# Patient Record
Sex: Female | Born: 1985 | Race: Black or African American | Hispanic: No | Marital: Single | Smoking: Never smoker
Health system: Southern US, Community
[De-identification: ages and names within clinical notes are randomized; demographics above are authoritative.]

## PROBLEM LIST (undated history)

## (undated) DIAGNOSIS — F419 Anxiety disorder, unspecified: Secondary | ICD-10-CM

## (undated) DIAGNOSIS — F329 Major depressive disorder, single episode, unspecified: Secondary | ICD-10-CM

## (undated) DIAGNOSIS — J45909 Unspecified asthma, uncomplicated: Secondary | ICD-10-CM

## (undated) DIAGNOSIS — R0600 Dyspnea, unspecified: Secondary | ICD-10-CM

## (undated) DIAGNOSIS — F32A Depression, unspecified: Secondary | ICD-10-CM

## (undated) DIAGNOSIS — G4730 Sleep apnea, unspecified: Secondary | ICD-10-CM

## (undated) HISTORY — PX: CHOLECYSTECTOMY: SHX55

## (undated) HISTORY — PX: TOENAIL EXCISION: SUR558

---

## 2017-04-19 ENCOUNTER — Other Ambulatory Visit: Payer: Self-pay | Admitting: Family Medicine

## 2017-04-19 DIAGNOSIS — Z1231 Encounter for screening mammogram for malignant neoplasm of breast: Secondary | ICD-10-CM

## 2017-04-20 ENCOUNTER — Encounter: Payer: Self-pay | Admitting: Radiology

## 2017-04-20 ENCOUNTER — Ambulatory Visit
Admission: RE | Admit: 2017-04-20 | Discharge: 2017-04-20 | Disposition: A | Discharge status: Home / Self Care | Payer: Medicare Other | Source: Ambulatory Visit | Attending: Family Medicine | Admitting: Family Medicine

## 2017-04-20 DIAGNOSIS — Z1231 Encounter for screening mammogram for malignant neoplasm of breast: Secondary | ICD-10-CM

## 2017-04-24 ENCOUNTER — Other Ambulatory Visit: Payer: Self-pay | Admitting: Family Medicine

## 2017-04-24 DIAGNOSIS — Z803 Family history of malignant neoplasm of breast: Secondary | ICD-10-CM

## 2017-05-09 ENCOUNTER — Other Ambulatory Visit: Payer: Medicare Other

## 2017-05-22 ENCOUNTER — Other Ambulatory Visit: Payer: Self-pay | Admitting: *Deleted

## 2017-05-22 ENCOUNTER — Inpatient Hospital Stay
Admission: RE | Admit: 2017-05-22 | Discharge: 2017-05-22 | Disposition: A | Discharge status: Home / Self Care | Payer: Self-pay | Source: Ambulatory Visit | Attending: *Deleted | Admitting: *Deleted

## 2017-05-22 DIAGNOSIS — Z9289 Personal history of other medical treatment: Secondary | ICD-10-CM

## 2017-06-01 ENCOUNTER — Other Ambulatory Visit: Payer: Medicare Other

## 2017-06-13 ENCOUNTER — Ambulatory Visit
Admission: RE | Admit: 2017-06-13 | Discharge: 2017-06-13 | Disposition: A | Discharge status: Home / Self Care | Payer: Medicare Other | Source: Ambulatory Visit | Attending: Family Medicine | Admitting: Family Medicine

## 2017-06-13 DIAGNOSIS — N644 Mastodynia: Secondary | ICD-10-CM | POA: Diagnosis present

## 2017-06-13 DIAGNOSIS — Z803 Family history of malignant neoplasm of breast: Secondary | ICD-10-CM

## 2017-10-29 ENCOUNTER — Encounter: Payer: Self-pay | Admitting: Emergency Medicine

## 2017-10-29 ENCOUNTER — Other Ambulatory Visit: Payer: Self-pay

## 2017-10-29 DIAGNOSIS — R0602 Shortness of breath: Secondary | ICD-10-CM | POA: Diagnosis present

## 2017-10-29 DIAGNOSIS — Z79899 Other long term (current) drug therapy: Secondary | ICD-10-CM | POA: Insufficient documentation

## 2017-10-29 DIAGNOSIS — J45901 Unspecified asthma with (acute) exacerbation: Secondary | ICD-10-CM | POA: Insufficient documentation

## 2017-10-29 NOTE — ED Triage Notes (Addendum)
Pt says about 4pm today her asthma flared up, was coughing until she vomited; tightness in chest; headache; pt used inhalers with no relief; still feeling short of breath; pt talking in complete coherent sentences; lungs clear to auscultation;

## 2017-10-30 ENCOUNTER — Emergency Department
Admission: EM | Admit: 2017-10-30 | Discharge: 2017-10-30 | Disposition: A | Discharge status: Home / Self Care | Payer: Medicare Other | Attending: Emergency Medicine | Admitting: Emergency Medicine

## 2017-10-30 DIAGNOSIS — J45901 Unspecified asthma with (acute) exacerbation: Secondary | ICD-10-CM | POA: Diagnosis not present

## 2017-10-30 HISTORY — DX: Unspecified asthma, uncomplicated: J45.909

## 2017-10-30 HISTORY — DX: Major depressive disorder, single episode, unspecified: F32.9

## 2017-10-30 HISTORY — DX: Depression, unspecified: F32.A

## 2017-10-30 MED ORDER — ONDANSETRON 4 MG PO TBDP
4.0000 mg | ORAL_TABLET | Freq: Once | ORAL | Status: AC
  Administered 2017-10-30: 4 mg via ORAL
  Filled 2017-10-30: qty 1

## 2017-10-30 MED ORDER — IPRATROPIUM-ALBUTEROL 0.5-2.5 (3) MG/3ML IN SOLN
3.0000 mL | Freq: Once | RESPIRATORY_TRACT | Status: AC
  Administered 2017-10-30: 3 mL via RESPIRATORY_TRACT
  Filled 2017-10-30: qty 3

## 2017-10-30 MED ORDER — PREDNISONE 50 MG PO TABS: 50.0000 mg | ORAL_TABLET | Freq: Every day | ORAL | 0 refills | Status: AC

## 2017-10-30 MED ORDER — ONDANSETRON 4 MG PO TBDP: 4.0000 mg | ORAL_TABLET | Freq: Three times a day (TID) | ORAL | 0 refills | Status: DC | PRN

## 2017-10-30 MED ORDER — SPACER/AERO CHAMBER MOUTHPIECE MISC: 1.0000 [IU] | 0 refills | Status: AC | PRN

## 2017-10-30 MED ORDER — PREDNISONE 20 MG PO TABS
60.0000 mg | ORAL_TABLET | Freq: Once | ORAL | Status: AC
  Administered 2017-10-30: 60 mg via ORAL
  Filled 2017-10-30: qty 3

## 2017-10-30 NOTE — Discharge Instructions (Signed)
Please take your steroids as prescribed for the next 4 days.  Follow-up with primary care as needed and return to the emergency department for any concerns.  It was a pleasure to take care of you today, and thank you for coming to our emergency department.  If you have any questions or concerns before leaving please ask the nurse to grab me and I'm more than happy to go through your aftercare instructions again.  If you were prescribed any opioid pain medication today such as Norco, Vicodin, Percocet, morphine, hydrocodone, or oxycodone please make sure you do not drive when you are taking this medication as it can alter your ability to drive safely.  If you have any concerns once you are home that you are not improving or are in fact getting worse before you can make it to your follow-up appointment, please do not hesitate to call 911 and come back for further evaluation.  Merrily BrittleNeil Yalitza Teed, MD

## 2017-10-30 NOTE — ED Notes (Signed)

## 2017-10-30 NOTE — ED Notes (Signed)
ED Provider at bedside. 

## 2017-10-30 NOTE — ED Provider Notes (Signed)
[facility] Emergency Department Provider Note  ____________________________________________   First MD Initiated Contact with Patient 10/30/17 [phone]     (approximate)  I have reviewed the triage vital signs and the nursing notes.   HISTORY  Chief Complaint No chief complaint on file.   HPI Erica Webster is a 32 y.o. female who self presents to the emergency department with gradual onset shortness of breath mild to moderate severity that began around 4 PM today.  She has a long-standing history of asthma and his use your inhaler at home with minimal improvement.  Her symptoms seem to be somewhat worse with exertion and somewhat improved with rest.  She has never been intubated.  She reports recent URI symptoms.  Past Medical History:  Diagnosis Date  . Asthma   . Depression     There are no active problems to display for this patient.   History reviewed. No pertinent surgical history.  Prior to Admission medications   Medication Sig Start Date End Date Taking? Authorizing Provider  albuterol (PROVENTIL HFA;VENTOLIN HFA) 108 (90 Base) MCG/ACT inhaler Inhale 2 puffs into the lungs every 4 (four) hours as needed for wheezing or shortness of breath.   Yes [provider]  ARIPiprazole (ABILIFY) 15 MG tablet Take 15 mg by mouth daily.   Yes [provider]  Fluticasone-Salmeterol (ADVAIR) 250-50 MCG/DOSE AEPB Inhale 1 puff into the lungs 2 (two) times daily.   Yes [provider]  vitamin B-12 (CYANOCOBALAMIN) 500 MCG tablet Take 500 mcg by mouth daily.   Yes [provider]  ondansetron (ZOFRAN ODT) 4 MG disintegrating tablet Take 1 tablet (4 mg total) by mouth every 8 (eight) hours as needed for nausea or vomiting. 10/30/17   Merrily Brittleifenbark, Marjie Chea, MD  predniSONE (DELTASONE) 50 MG tablet Take 1 tablet (50 mg total) by mouth daily for 4 days. 10/30/17 11/03/17  Merrily Brittleifenbark, Anastasya Jewell, MD  Spacer/Aero Chamber Mouthpiece MISC 1 Units by  Does not apply route every 4 (four) hours as needed (wheezing). 10/30/17   Merrily Brittleifenbark, Kenston Longton, MD    Allergies Shellfish allergy and Penicillins  Family History  Adopted: Yes  Problem Relation Age of Onset  . Breast cancer Neg Hx     Social History Social History   Tobacco Use  . Smoking status: Never Smoker  . Smokeless tobacco: Never Used  Substance Use Topics  . Alcohol use: No    Frequency: Never  . Drug use: No    Review of Systems Constitutional: No fever/chills ENT: No sore throat. Cardiovascular: Denies chest pain. Respiratory: Positive for shortness of breath. Gastrointestinal: No abdominal pain.  No nausea, no vomiting.  No diarrhea.  No constipation. Musculoskeletal: Negative for back pain. Neurological: Negative for headaches   ____________________________________________   PHYSICAL EXAM:  VITAL SIGNS: ED Triage Vitals  Enc Vitals Group     BP 10/29/17 2258 130/81     Pulse Rate 10/29/17 2258 (!) 117     Resp 10/29/17 2258 16     Temp 10/29/17 2258 99.4 F (37.4 C)     Temp Source 10/29/17 2258 Oral     SpO2 10/29/17 2258 97 %     Weight 10/29/17 2258 285 lb (129.3 kg)     Height 10/29/17 2258 5\' 1"  (1.549 m)     Head Circumference --      Peak Flow --      Pain Score 10/29/17 2305 9     Pain Loc --  Pain Edu? --      Excl. in GC? --     Constitutional: Alert and oriented x4 pleasant cooperative speaks in full clear sentences no diaphoresis Head: Atraumatic. Nose: No congestion/rhinnorhea. Mouth/Throat: No trismus Neck: No stridor.   Cardiovascular: Rate and rhythm Respiratory: Normal respiratory effort.  Slight expiratory wheeze throughout but moving good air Gastrointestinal: Soft nontender Neurologic:  Normal speech and language. No gross focal neurologic deficits are appreciated.  Skin:  Skin is warm, dry and intact. No rash noted.    ____________________________________________  LABS (all labs ordered are listed, but only  abnormal results are displayed)  Labs Reviewed - No data to display   __________________________________________  EKG   ____________________________________________  RADIOLOGY   ____________________________________________   DIFFERENTIAL includes but not limited to  Asthma exacerbation, upper respiratory tract infection, pneumonia, pneumothorax   PROCEDURES  Procedure(s) performed: no  Procedures  Critical Care performed: no  Observation: no ____________________________________________   INITIAL IMPRESSION / ASSESSMENT AND PLAN / ED COURSE  Pertinent labs & imaging results that were available during my care of the patient were reviewed by me and considered in my medical decision making (see chart for details).  Patient arrives hemodynamically stable and very well-appearing with mild wheeze.  Given 2 DuoNeb's here with improvement in her symptoms.  I will give her a 5-day course of prednisone as well as prescribe her a spacer at home.  Strict return precautions have been given and the patient verbalizes understanding and agreement with the plan.      ____________________________________________   FINAL CLINICAL IMPRESSION(S) / ED DIAGNOSES  Final diagnoses:  Mild asthma with exacerbation, unspecified whether persistent      NEW MEDICATIONS STARTED DURING THIS VISIT:  Discharge Medication List as of 10/30/2017 12:40 AM    START taking these medications   Details  ondansetron (ZOFRAN ODT) 4 MG disintegrating tablet Take 1 tablet (4 mg total) by mouth every 8 (eight) hours as needed for nausea or vomiting., Starting Mon 10/30/2017, Print    predniSONE (DELTASONE) 50 MG tablet Take 1 tablet (50 mg total) by mouth daily for 4 days., Starting Mon 10/30/2017, Until Fri 11/03/2017, Print    Spacer/Aero Chamber Mouthpiece MISC 1 Units by Does not apply route every 4 (four) hours as needed (wheezing)., Starting Mon 10/30/2017, Print         Note:  This document  was prepared using Dragon voice recognition software and may include unintentional dictation errors.      Merrily Brittle, MD 10/30/17 [phone]

## 2018-03-02 ENCOUNTER — Emergency Department
Admission: EM | Admit: 2018-03-02 | Discharge: 2018-03-02 | Disposition: A | Discharge status: Home / Self Care | Payer: Medicare Other | Attending: Emergency Medicine | Admitting: Emergency Medicine

## 2018-03-02 ENCOUNTER — Encounter: Payer: Self-pay | Admitting: Emergency Medicine

## 2018-03-02 ENCOUNTER — Other Ambulatory Visit: Payer: Self-pay

## 2018-03-02 DIAGNOSIS — J45909 Unspecified asthma, uncomplicated: Secondary | ICD-10-CM | POA: Diagnosis not present

## 2018-03-02 DIAGNOSIS — R112 Nausea with vomiting, unspecified: Secondary | ICD-10-CM | POA: Diagnosis not present

## 2018-03-02 DIAGNOSIS — Z79899 Other long term (current) drug therapy: Secondary | ICD-10-CM | POA: Diagnosis not present

## 2018-03-02 DIAGNOSIS — R197 Diarrhea, unspecified: Secondary | ICD-10-CM | POA: Insufficient documentation

## 2018-03-02 MED ORDER — ONDANSETRON 4 MG PO TBDP: 4.0000 mg | ORAL_TABLET | Freq: Three times a day (TID) | ORAL | 0 refills | Status: DC | PRN

## 2018-03-02 MED ORDER — ONDANSETRON 4 MG PO TBDP
4.0000 mg | ORAL_TABLET | Freq: Once | ORAL | Status: AC
  Administered 2018-03-02: 4 mg via ORAL
  Filled 2018-03-02: qty 1

## 2018-03-02 MED ORDER — LOPERAMIDE HCL 2 MG PO CAPS
4.0000 mg | ORAL_CAPSULE | Freq: Once | ORAL | Status: AC
  Administered 2018-03-02: 4 mg via ORAL
  Filled 2018-03-02: qty 2

## 2018-03-02 NOTE — Discharge Instructions (Signed)
You likely have symptoms of a viral gastroenteritis (stomach flu), which is a common cause of nausea, vomiting, and diarrhea. Your symptoms appear to be getting better. Take the nausea medicine as needed. Drink plenty of fluids to prevent dehydration. Follow-up with your provider, next week as scheduled.

## 2018-03-02 NOTE — ED Notes (Signed)
Pt stating that she was seen by PCP last month and was placed on Singular for her asthma. Pt stating a week after she started taking it she was having n/v/d. Pt stating that she stopped taking the Singular 2 weeks ago. Pt stating that 2 days ago she started with sinus congestion and cough. Pt denying fevers.

## 2018-03-02 NOTE — ED Triage Notes (Signed)
Says her pcp put her on singulair about 3 weeks ago.  A week after that she started having diarrhea and vomiting.  Now she has a cold.  Her doctor could not see her today, but has appt jone 6.  Patient in nad.

## 2018-03-05 NOTE — ED Provider Notes (Signed)
[facility] Emergency Department Provider Note ____________________________________________  Time seen: 1300  I have reviewed the triage vital signs and the nursing notes.  HISTORY  Chief Complaint  Diarrhea; Emesis; and URI  HPI Erica Webster is a 32 y.o. female presents to the ED from her adult daycare, for evaluation of a one-week complaint of intermittent nausea, vomiting, and diarrhea.  Patient initially thought that she had a reaction to Singulair that her provider placed on about 3 weeks prior.  She describes taking medication for nearly a week, before she discontinued medication.  She has not had any fevers, chills, sweats.  She also reports nausea today without any incident vomiting.  She is continued to have some loose stools but has been able to tolerate solid foods.  She was scheduled to see her doctor on June 6, but presented today for further evaluation.  She denies any interim fevers, chills, or sweats.  She also denies any sick contacts, recent travel, or other exposures.  X medications daily for asthma and depression.  Past Medical History:  Diagnosis Date  . Asthma   . Depression     There are no active problems to display for this patient.   Past Surgical History:  Procedure Laterality Date  . TOENAIL EXCISION      Prior to Admission medications   Medication Sig Start Date End Date Taking? Authorizing Provider  albuterol (PROVENTIL HFA;VENTOLIN HFA) 108 (90 Base) MCG/ACT inhaler Inhale 2 puffs into the lungs every 4 (four) hours as needed for wheezing or shortness of breath.    [provider]  ARIPiprazole (ABILIFY) 15 MG tablet Take 15 mg by mouth daily.    [provider]  Fluticasone-Salmeterol (ADVAIR) 250-50 MCG/DOSE AEPB Inhale 1 puff into the lungs 2 (two) times daily.    [provider]  ondansetron (ZOFRAN ODT) 4 MG disintegrating tablet Take 1 tablet (4 mg total) by mouth every 8 (eight) hours as  needed. 03/02/18   Sunny Gains, Charlesetta IvoryJenise V Bacon, PA-C  Spacer/Aero Chamber Mouthpiece MISC 1 Units by Does not apply route every 4 (four) hours as needed (wheezing). 10/30/17   Merrily Brittleifenbark, Neil, MD  vitamin B-12 (CYANOCOBALAMIN) 500 MCG tablet Take 500 mcg by mouth daily.    [provider]    Allergies Shellfish allergy and Penicillins  Family History  Adopted: Yes  Problem Relation Age of Onset  . Breast cancer Neg Hx     Social History Social History   Tobacco Use  . Smoking status: Never Smoker  . Smokeless tobacco: Never Used  Substance Use Topics  . Alcohol use: No    Frequency: Never  . Drug use: No    Review of Systems  Constitutional: Negative for fever. Eyes: Negative for visual changes. ENT: Negative for sore throat. Cardiovascular: Negative for chest pain. Respiratory: Negative for shortness of breath. Gastrointestinal: Negative for abdominal pain. Reports improved vomiting and diarrhea. Genitourinary: Negative for dysuria. Musculoskeletal: Negative for back pain. Skin: Negative for rash. Neurological: Negative for headaches, focal weakness or numbness. ____________________________________________  PHYSICAL EXAM:  VITAL SIGNS: ED Triage Vitals [03/02/18 1106]  Enc Vitals Group     BP 134/71     Pulse Rate 98     Resp 16     Temp 98.1 F (36.7 C)     Temp Source Oral     SpO2 97 %     Weight 293 lb (132.9 kg)     Height 5\' 1"  (1.549 m)  Head Circumference      Peak Flow      Pain Score 0     Pain Loc      Pain Edu?      Excl. in GC?     Constitutional: Alert and oriented. Well appearing and in no distress. Head: Normocephalic and atraumatic. Eyes: Conjunctivae are normal. PERRL. Normal extraocular movements Ears: Canals clear. TMs intact bilaterally. Nose: No congestion/rhinorrhea/epistaxis. Mouth/Throat: Mucous membranes are moist. Cardiovascular: Normal rate, regular rhythm. Normal distal pulses. Respiratory: Normal respiratory  effort. No wheezes/rales/rhonchi. Gastrointestinal: Soft and nontender. No distention, rebound, guarding, or rigidity.  Normoactive bowel sounds noted. Musculoskeletal: Nontender with normal range of motion in all extremities.  Neurologic:  Normal gait without ataxia. Normal speech and language. No gross focal neurologic deficits are appreciated. Skin:  Skin is warm, dry and intact. No rash noted. Psychiatric: Mood and affect are normal. Patient exhibits appropriate insight and judgment. ____________________________________________  PROCEDURES  Procedures Zofran 4 mg ODT Imodium 4 mg PO ____________________________________________  INITIAL IMPRESSION / ASSESSMENT AND PLAN / ED COURSE  She with ED evaluation of intermittent nausea, vomiting, and diarrhea.  Patient symptoms have been improving over the last few days.  She was concerned for a possible drug side effect, but her symptoms seem likely due to a viral etiology.  She is stable in the ED today and has an overall benign exam.  She will be discharged with prescriptions for Zofran to dose as needed for nausea and vomiting.  She may dose over-the-counter Imodium for any ongoing diarrhea symptoms.  She is given instructions on a BRAT diet, and will follow up with the primary provider as scheduled.  Return precautions have been reviewed.  ____________________________________________  FINAL CLINICAL IMPRESSION(S) / ED DIAGNOSES  Final diagnoses:  Nausea vomiting and diarrhea      Karmen Stabs, Charlesetta Ivory, PA-C 03/05/18 1909    Dionne Bucy, MD 03/13/18 1319

## 2019-03-08 ENCOUNTER — Other Ambulatory Visit: Payer: Self-pay

## 2019-03-08 ENCOUNTER — Encounter: Payer: Self-pay | Admitting: Emergency Medicine

## 2019-03-08 ENCOUNTER — Emergency Department
Admission: EM | Admit: 2019-03-08 | Discharge: 2019-03-08 | Disposition: A | Discharge status: Home / Self Care | Payer: Medicare Other | Attending: Emergency Medicine | Admitting: Emergency Medicine

## 2019-03-08 DIAGNOSIS — Z79899 Other long term (current) drug therapy: Secondary | ICD-10-CM | POA: Diagnosis not present

## 2019-03-08 DIAGNOSIS — R062 Wheezing: Secondary | ICD-10-CM | POA: Diagnosis present

## 2019-03-08 DIAGNOSIS — J4521 Mild intermittent asthma with (acute) exacerbation: Secondary | ICD-10-CM | POA: Diagnosis not present

## 2019-03-08 MED ORDER — PREDNISONE 50 MG PO TABS: ORAL_TABLET | ORAL | 0 refills | Status: DC

## 2019-03-08 NOTE — ED Triage Notes (Signed)
Patient states that she has a history of asthma and feels like she is having an asthma flare that started this morning. Patient states that she has used her inhaler twice with some relief but the symptoms return. Patient with clear lung sounds at this time.

## 2019-03-08 NOTE — ED Provider Notes (Signed)
[facility] Emergency Department Provider Note  ____________________________________________  Time seen: Approximately 8:36 PM  I have reviewed the triage vital signs and the nursing notes.   HISTORY  Chief Complaint Asthma    HPI Erica Webster is a 33 y.o. female presents to the emergency department with concern for asthma.  Patient states that she experience wheezing and chest tightness with mild cough that started today.  Patient states that she has had to use her albuterol inhaler twice.  Patient states that she typically has an asthma exacerbation this time of year.  She denies rhinorrhea, congestion, body aches or fever.  No other alleviating measures have been attempted.        Past Medical History:  Diagnosis Date  . Asthma   . Depression     There are no active problems to display for this patient.   Past Surgical History:  Procedure Laterality Date  . TOENAIL EXCISION      Prior to Admission medications   Medication Sig Start Date End Date Taking? Authorizing Provider  albuterol (PROVENTIL HFA;VENTOLIN HFA) 108 (90 Base) MCG/ACT inhaler Inhale 2 puffs into the lungs every 4 (four) hours as needed for wheezing or shortness of breath.    [provider]  ARIPiprazole (ABILIFY) 15 MG tablet Take 15 mg by mouth daily.    [provider]  Fluticasone-Salmeterol (ADVAIR) 250-50 MCG/DOSE AEPB Inhale 1 puff into the lungs 2 (two) times daily.    [provider]  ondansetron (ZOFRAN ODT) 4 MG disintegrating tablet Take 1 tablet (4 mg total) by mouth every 8 (eight) hours as needed. 03/02/18   Menshew, Charlesetta Ivory, PA-C  predniSONE (DELTASONE) 50 MG tablet Take one tablet once daily for the next five days. 03/08/19   Orvil Feil, PA-C  Spacer/Aero Chamber Mouthpiece MISC 1 Units by Does not apply route every 4 (four) hours as needed (wheezing). 10/30/17   Merrily Brittle, MD  vitamin B-12 (CYANOCOBALAMIN) 500 MCG  tablet Take 500 mcg by mouth daily.    [provider]    Allergies Shellfish allergy and Penicillins  Family History  Adopted: Yes  Problem Relation Age of Onset  . Breast cancer Neg Hx     Social History Social History   Tobacco Use  . Smoking status: Never Smoker  . Smokeless tobacco: Never Used  Substance Use Topics  . Alcohol use: No    Frequency: Never  . Drug use: No     Review of Systems  Constitutional: No fever/chills Eyes: No visual changes. No discharge ENT: No upper respiratory complaints. Cardiovascular: no chest pain. Respiratory: Patient has had sporadic cough and wheezing.  Gastrointestinal: No abdominal pain.  No nausea, no vomiting.  No diarrhea.  No constipation. Musculoskeletal: Negative for musculoskeletal pain. Skin: Negative for rash, abrasions, lacerations, ecchymosis. Neurological: Negative for headaches, focal weakness or numbness.   ____________________________________________   PHYSICAL EXAM:  VITAL SIGNS: ED Triage Vitals  Enc Vitals Group     BP 03/08/19 2012 139/89     Pulse Rate 03/08/19 2012 (!) 105     Resp 03/08/19 2012 18     Temp 03/08/19 2012 98.4 F (36.9 C)     Temp Source 03/08/19 2012 Oral     SpO2 03/08/19 2012 98 %     Weight 03/08/19 2016 285 lb (129.3 kg)     Height 03/08/19 2016 5\' 2"  (1.575 m)     Head Circumference --      Peak  Flow --      Pain Score 03/08/19 2016 0     Pain Loc --      Pain Edu? --      Excl. in GC? --      Constitutional: Alert and oriented. Well appearing and in no acute distress. Eyes: Conjunctivae are normal. PERRL. EOMI. Head: Atraumatic. ENT:      Ears: TMs are pearly.      Nose: No congestion/rhinnorhea.      Mouth/Throat: Mucous membranes are moist.  Neck: No stridor.  No cervical spine tenderness to palpation. Cardiovascular: Normal rate, regular rhythm. Normal S1 and S2.  Good peripheral circulation. Respiratory: Normal respiratory effort without tachypnea or  retractions. Lungs CTAB. Good air entry to the bases with no decreased or absent breath sounds. Skin:  Skin is warm, dry and intact. No rash noted. Psychiatric: Mood and affect are normal. Speech and behavior are normal. Patient exhibits appropriate insight and judgement.   ____________________________________________   LABS (all labs ordered are listed, but only abnormal results are displayed)  Labs Reviewed - No data to display ____________________________________________  EKG   ____________________________________________  RADIOLOGY   No results found.  ____________________________________________    PROCEDURES  Procedure(s) performed:    Procedures    Medications - No data to display   ____________________________________________   INITIAL IMPRESSION / ASSESSMENT AND PLAN / ED COURSE  Pertinent labs & imaging results that were available during my care of the patient were reviewed by me and considered in my medical decision making (see chart for details).  Review of the [place] CSRS was performed in accordance of the [facility] prior to dispensing any controlled drugs.         Assessment and Plan:  Asthma Patient presents to the emergency department with concern for asthma.  Patient reports wheezing and chest tightness that occurred earlier in the day that resolved after she used her albuterol inhaler.  On physical exam, patient had no adventitious lung sounds.  She had no increased work of breathing or use of accessory muscles for respiration.  Patient was started on prednisone once daily for the next 5 days.  She was advised to follow-up with primary care as needed.  She assured me that she had plenty of albuterol.  All patient questions were answered.     ____________________________________________  FINAL CLINICAL IMPRESSION(S) / ED DIAGNOSES  Final diagnoses:  Mild intermittent asthma with exacerbation      NEW MEDICATIONS STARTED DURING THIS  VISIT:  ED Discharge Orders         Ordered    predniSONE (DELTASONE) 50 MG tablet     03/08/19 2035              This chart was dictated using voice recognition software/Dragon. Despite best efforts to proofread, errors can occur which can change the meaning. Any change was purely unintentional.    Gasper LloydWoods, Tanayah Squitieri M, PA-C 03/08/19 2039    Dionne BucySiadecki, Sebastian, MD 03/08/19 2045

## 2019-12-05 ENCOUNTER — Emergency Department: Payer: Medicare Other

## 2019-12-05 ENCOUNTER — Other Ambulatory Visit: Payer: Self-pay

## 2019-12-05 ENCOUNTER — Emergency Department
Admission: EM | Admit: 2019-12-05 | Discharge: 2019-12-06 | Disposition: A | Discharge status: Home / Self Care | Payer: Medicare Other | Attending: Emergency Medicine | Admitting: Emergency Medicine

## 2019-12-05 DIAGNOSIS — J4521 Mild intermittent asthma with (acute) exacerbation: Secondary | ICD-10-CM

## 2019-12-05 DIAGNOSIS — R059 Cough, unspecified: Secondary | ICD-10-CM

## 2019-12-05 DIAGNOSIS — R0602 Shortness of breath: Secondary | ICD-10-CM | POA: Diagnosis present

## 2019-12-05 DIAGNOSIS — Z79899 Other long term (current) drug therapy: Secondary | ICD-10-CM | POA: Insufficient documentation

## 2019-12-05 DIAGNOSIS — R05 Cough: Secondary | ICD-10-CM

## 2019-12-05 DIAGNOSIS — Z20822 Contact with and (suspected) exposure to covid-19: Secondary | ICD-10-CM | POA: Insufficient documentation

## 2019-12-05 DIAGNOSIS — R0600 Dyspnea, unspecified: Secondary | ICD-10-CM

## 2019-12-05 NOTE — ED Triage Notes (Signed)
PT to ED via EMS for SOB x2 weeks. HX of asthma, no relief with albuterol inhaler. Upon ems arrival pt o2 was 98%. After 1 albuterol neb pt o2 is currently 99%. Possible covid exposure 3 weeks ago.

## 2019-12-05 NOTE — ED Provider Notes (Signed)
[facility] Emergency Department Provider Note  ____________________________________________   First MD Initiated Contact with Patient 12/05/19 2321     (approximate)  I have reviewed the triage vital signs and the nursing notes.   HISTORY  Chief Complaint Shortness of Breath    HPI Erica Webster is a 34 y.o. female with below list of previous medical conditions presents to the emergency department via EMS secondary to dyspnea with onset today despite the fact the note states that it has been for the past 2 weeks.  Patient states that earlier today she felt difficulty breathing with wheezing and cough and as a result used her albuterol inhaler without much improvement.  Patient states that subsequently tonight she used her inhaler again with minimal improvement prompting her visit to the emergency department.  Patient denies any fever afebrile on presentation.  Patient does admit to a positive Covid exposure at the adult daycare where she was cared for.  Patient denies any diarrhea no vomiting.  Patient denies any other symptoms       Past Medical History:  Diagnosis Date  . Asthma   . Depression     There are no problems to display for this patient.   Past Surgical History:  Procedure Laterality Date  . TOENAIL EXCISION      Prior to Admission medications   Medication Sig Start Date End Date Taking? Authorizing Provider  albuterol (PROVENTIL HFA;VENTOLIN HFA) 108 (90 Base) MCG/ACT inhaler Inhale 2 puffs into the lungs every 4 (four) hours as needed for wheezing or shortness of breath.    [provider]  ARIPiprazole (ABILIFY) 15 MG tablet Take 15 mg by mouth daily.    [provider]  Fluticasone-Salmeterol (ADVAIR) 250-50 MCG/DOSE AEPB Inhale 1 puff into the lungs 2 (two) times daily.    [provider]  ondansetron (ZOFRAN ODT) 4 MG disintegrating tablet Take 1 tablet (4 mg total) by mouth every 8 (eight) hours as  needed. 03/02/18   Menshew, Dannielle Karvonen, PA-C  predniSONE (DELTASONE) 50 MG tablet Take one tablet once daily for the next five days. 03/08/19   Lannie Fields, PA-C  Spacer/Aero Chamber Mouthpiece MISC 1 Units by Does not apply route every 4 (four) hours as needed (wheezing). 10/30/17   Darel Hong, MD  vitamin B-12 (CYANOCOBALAMIN) 500 MCG tablet Take 500 mcg by mouth daily.    [provider]    Allergies Shellfish allergy and Penicillins  Family History  Adopted: Yes  Problem Relation Age of Onset  . Breast cancer Neg Hx     Social History Social History   Tobacco Use  . Smoking status: Never Smoker  . Smokeless tobacco: Never Used  Substance Use Topics  . Alcohol use: No  . Drug use: No    Review of Systems Constitutional: No fever/chills Eyes: No visual changes. ENT: No sore throat. Cardiovascular: Denies chest pain. Respiratory: Positive for cough, wheezing and dyspnea Gastrointestinal: No abdominal pain.  No nausea, no vomiting.  No diarrhea.  No constipation. Genitourinary: Negative for dysuria. Musculoskeletal: Negative for neck pain.  Negative for back pain. Integumentary: Negative for rash. Neurological: Negative for headaches, focal weakness or numbness.   ____________________________________________   PHYSICAL EXAM:  VITAL SIGNS: ED Triage Vitals  Enc Vitals Group     BP 12/05/19 2302 135/86     Pulse Rate 12/05/19 2318 100     Resp 12/05/19 2302 18     Temp 12/05/19 2318 97.7 F (36.5  C)     Temp Source 12/05/19 2318 Oral     SpO2 12/05/19 2300 100 %     Weight 12/05/19 2304 113.4 kg (250 lb)     Height 12/05/19 2304 1.575 m (5\' 2" )     Head Circumference --      Peak Flow --      Pain Score 12/05/19 2302 7     Pain Loc --      Pain Edu? --      Excl. in GC? --     Constitutional: Alert and oriented.  Eyes: Conjunctivae are normal.  Mouth/Throat: Patient is wearing a mask. Neck: No stridor.  No meningeal signs.     Cardiovascular: Normal rate, regular rhythm. Good peripheral circulation. Grossly normal heart sounds. Respiratory: Normal respiratory effort.  No retractions. Gastrointestinal: Soft and nontender. No distention.  Musculoskeletal: No lower extremity tenderness nor edema. No gross deformities of extremities. Neurologic:  Normal speech and language. No gross focal neurologic deficits are appreciated.  Skin:  Skin is warm, dry and intact. Psychiatric: Mood and affect are normal. Speech and behavior are normal.  ____________________________________________   LABS (all labs ordered are listed, but only abnormal results are displayed)  Labs Reviewed  CBC - Abnormal; Notable for the following components:      Result Value   MCHC 29.8 (*)    All other components within normal limits  COMPREHENSIVE METABOLIC PANEL - Abnormal; Notable for the following components:   Glucose, Bld 107 (*)    Creatinine, Ser 1.27 (*)    Calcium 8.3 (*)    GFR calc non Af Amer 54 (*)    All other components within normal limits  RESPIRATORY PANEL BY RT PCR (FLU A&B, COVID)  TROPONIN I (HIGH SENSITIVITY)     RADIOLOGY I, Cashion N Lenzy Kerschner, personally viewed and evaluated these images (plain radiographs) as part of my medical decision making, as well as reviewing the written report by the radiologist.  ED MD interpretation: No acute cardiopulmonary abnormality noted on chest x-ray  Official radiology report(s): DG Chest Port 1 View  Result Date: 12/05/2019 CLINICAL DATA:  Shortness of breath for 2 weeks, history of asthma without relief from albuterol EXAM: PORTABLE CHEST 1 VIEW COMPARISON:  None. FINDINGS: No consolidation, features of edema, pneumothorax, or effusion. Pulmonary vascularity is normally distributed. The cardiomediastinal contours are unremarkable. No acute osseous or soft tissue abnormality. Telemetry leads overlie the chest. IMPRESSION: No acute cardiopulmonary abnormality. Electronically  Signed   By: 02/04/2020 M.D.   On: 12/05/2019 23:41      Procedures   ____________________________________________   INITIAL IMPRESSION / MDM / ASSESSMENT AND PLAN / ED COURSE  As part of my medical decision making, I reviewed the following data within the electronic MEDICAL RECORD NUMBER 34 year old female presented with above-stated history and physical exam concerning for possible asthma exacerbation however patient with no dyspnea or wheezing at present.  Chest x-ray normal.  Given concern for possible Covid exposure Covid testing performed which was negative as well as influenza.      ____________________________________________  FINAL CLINICAL IMPRESSION(S) / ED DIAGNOSES  Final diagnoses:  Mild intermittent asthma with exacerbation     MEDICATIONS GIVEN DURING THIS VISIT:  Medications - No data to display   ED Discharge Orders    None      *Please note:  Teyanna Thielman was evaluated in Emergency Department on 12/06/2019 for the symptoms described in the history of present illness. She was evaluated  in the context of the global COVID-19 pandemic, which necessitated consideration that the patient might be at risk for infection with the SARS-CoV-2 virus that causes COVID-19. Institutional protocols and algorithms that pertain to the evaluation of patients at risk for COVID-19 are in a state of rapid change based on information released by regulatory bodies including the CDC and federal and state organizations. These policies and algorithms were followed during the patient's care in the ED.  Some ED evaluations and interventions may be delayed as a result of limited staffing during the pandemic.*  Note:  This document was prepared using Dragon voice recognition software and may include unintentional dictation errors.   Darci Current, MD 12/06/19 [phone]

## 2019-12-06 DIAGNOSIS — J4521 Mild intermittent asthma with (acute) exacerbation: Secondary | ICD-10-CM | POA: Diagnosis not present

## 2019-12-06 LAB — COMPREHENSIVE METABOLIC PANEL
ALT: 22 U/L (ref 0–44)
AST: 20 U/L (ref 15–41)
Albumin: 3.6 g/dL (ref 3.5–5.0)
Alkaline Phosphatase: 99 U/L (ref 38–126)
Anion gap: 6 (ref 5–15)
BUN: 18 mg/dL (ref 6–20)
CO2: 27 mmol/L (ref 22–32)
Calcium: 8.3 mg/dL — ABNORMAL LOW (ref 8.9–10.3)
Chloride: 107 mmol/L (ref 98–111)
Creatinine, Ser: 1.27 mg/dL — ABNORMAL HIGH (ref 0.44–1.00)
GFR calc Af Amer: >60 mL/min (ref >60)
GFR calc non Af Amer: 54 mL/min — ABNORMAL LOW (ref >60)
Glucose, Bld: 107 mg/dL — ABNORMAL HIGH (ref 70–99)
Potassium: 3.7 mmol/L (ref 3.5–5.1)
Sodium: 140 mmol/L (ref 135–145)
Total Bilirubin: 0.5 mg/dL (ref 0.3–1.2)
Total Protein: 7 g/dL (ref 6.5–8.1)

## 2019-12-06 LAB — CBC
HCT: 40.6 % (ref 36.0–46.0)
Hemoglobin: 12.1 g/dL (ref 12.0–15.0)
MCH: 26.4 pg (ref 26.0–34.0)
MCHC: 29.8 g/dL — ABNORMAL LOW (ref 30.0–36.0)
MCV: 88.6 fL (ref 80.0–100.0)
Platelets: 262 10*3/uL (ref 150–400)
RBC: 4.58 MIL/uL (ref 3.87–5.11)
RDW: 14.6 % (ref 11.5–15.5)
WBC: 5.3 10*3/uL (ref 4.0–10.5)
nRBC: 0 % (ref 0.0–0.2)

## 2019-12-06 LAB — RESPIRATORY PANEL BY RT PCR (FLU A&B, COVID)
Influenza A by PCR: NEGATIVE
Influenza B by PCR: NEGATIVE
SARS Coronavirus 2 by RT PCR: NEGATIVE

## 2019-12-06 LAB — TROPONIN I (HIGH SENSITIVITY): Troponin I (High Sensitivity): <2 ng/L (ref <18)

## 2019-12-06 MED ORDER — SODIUM CHLORIDE 0.9 % IV BOLUS
1000.0000 mL | Freq: Once | INTRAVENOUS | Status: AC
Start: 2019-12-06 — End: 2019-12-06
  Administered 2019-12-06: 02:00:00 1000 mL via INTRAVENOUS

## 2020-05-12 ENCOUNTER — Other Ambulatory Visit: Payer: Self-pay

## 2020-05-12 ENCOUNTER — Ambulatory Visit: Payer: Medicare Other | Attending: Family Medicine

## 2020-05-12 DIAGNOSIS — R42 Dizziness and giddiness: Secondary | ICD-10-CM | POA: Diagnosis not present

## 2020-05-12 NOTE — Therapy (Addendum)
[facility] MAIN [facility] SERVICES [address] [place], [place], [address] Phone: [phone]   Fax:  [phone]  Physical Therapy Evaluation  Patient Details  Name: Erica Webster MRN: [id number] Date of Birth: July 16, 1986 Referring Provider (PT): Dr. Alinda Dooms   Encounter Date: 05/12/2020   PT End of Session - 05/12/20 1045    Visit Number 1    Number of Visits 9    Date for PT Re-Evaluation 07/07/20    Authorization Type Eval: 05/12/20    PT Start Time 1057    PT Stop Time 1200    PT Time Calculation (min) 63 min    Equipment Utilized During Treatment Gait belt    Activity Tolerance Patient tolerated treatment well    Behavior During Therapy [facility] for tasks assessed/performed;Flat affect           Past Medical History:  Diagnosis Date  . Asthma   . Depression     Past Surgical History:  Procedure Laterality Date  . TOENAIL EXCISION      There were no vitals filed for this visit.    Subjective Assessment - 05/12/20 1256    Subjective Dizziness    Pertinent History The episodes started in early June. She describes her dizziness as the room is spinning, which lasts for less than a minute, then goes away. Sharp movements to right or left, abrupt stops, and moving too fast aggravate her symptoms. Laying down and resting helps to ease the symptoms. She has an episode about 2x/week, but her last one was July 29th when she was at her adult day program. When she came home, she rested and when she woke up her symptoms were gone. She hasn't had an episodes since then. When she was younger, she used to get dizzy when standing up really fast from sitting. No falls, but a couple of near stumbles. When she gets out of the shower, she has to hold onto the wall firmly so she doesn't fall. Since it has started in June, her symptoms have gotten better. She has a history of migraines but hasn't had one since she was 13 or 14. She was prescribed  Meclizine for the dizziness but stopped taking due to nausea. That is the only recent medication change. Patient had ear pain, itchiness, and hearing loss in both ears due to ear wax build up that is now resolved. Her last hearing test was normal. She denies any vision changes. She notes leakage after urinating which has started recently. No other red flags. She lives by herself but goes to an adult day program 3x/week since 2015. 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 She likes to shop, cook, talk to family and friends, watch TV, and listen to music.    Limitations Walking    Patient Stated Goals Decrease dizziness    Currently in Pain? No/denies             VESTIBULAR AND BALANCE EVALUATION   HISTORY:  Subjective history of current problem:  The episodes started in early June. She describes her dizziness as the room is spinning, which lasts for less than a minute, then goes away. Sharp movements to right or left, abrupt stops, and moving too fast aggravate her symptoms. Laying down and resting helps to ease the symptoms. She has an episode about 2x/week, but her last one was July 29th when she was at her adult day program. When she came home, she rested and when she  woke up her symptoms were gone. She hasn't had an episodes since then. When she was younger, she used to get dizzy when standing up really fast from sitting. No falls, but a couple of near stumbles. When she gets out of the shower, she has to hold onto the wall firmly so she doesn't fall. Since it has started in June, her symptoms have gotten better. She has a history of migraines but hasn't had one since she was 13 or 14. She was prescribed Meclizine for the dizziness but stopped taking due to nausea. That is the only recent medication change. Patient had ear pain, itchiness, and hearing loss in both ears due to ear wax build up that is now resolved. Her last hearing test was normal. She denies any vision changes. She  notes leakage after urinating which has started recently. No other red flags. She lives by herself but goes to an adult day program 3x/week since 2015. 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 She likes to shop, cook, talk to family and friends, watch TV, and listen to music.  Description of dizziness: (vertigo, unsteadiness, lightheadedness, falling, general unsteadiness, whoozy, swimmy-headed sensation, aural fullness) room spinning Frequency: 2x/week a minute, went back to normal Duration: Typically less than 1 minute Symptom nature: Motion provoked  Provocative Factors: sharp movements to right or left, abrupt stop, moving too fast Easing Factors: laying down  Progression of symptoms: (better, worse, no change since onset) better History of similar episodes: remote history of similar episode, sitting down and get up get up really fast felt dizzy in the past  Falls (yes/no): No falls, but near stumbles. When she gets out of the shower, she has to hold onto the wall firmly. Number of falls in past 6 months: none  Prior Functional Level: Pt lives alone, doesn't drive. Goes to an adult day program 3d/wk;  Auditory complaints (tinnitus, pain, drainage, hearing loss, aural fullness): Pain in R ear last year until April and then switched to L ear. Symptoms include itchy, pain, and hard to hear due to ear wax build up in both ears. Now is resolved and last hearing test was normal.  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 Does not wear glasses.  Red Flags: (dysarthria, dysphagia, drop attacks, bowel and bladder changes, recent weight loss/gain) Leakage after urination which started recently, otherwise negative    EXAMINATION  POSTURE: No gross deficits identified  NEUROLOGICAL SCREEN: (2+ unless otherwise noted.) N=normal  Ab=abnormal  Level Dermatome R L Myotome  R L  C3 Anterior Neck N N Sidebend C2-3 N N  C4 Top of Shoulder N N Shoulder Shrug C4 N N  C5 Lateral Upper Arm N N Shoulder ABD C4-5 N N  C6 Lateral Arm/ Thumb N N Arm Flex/ Wrist Ext C5-6 N N  C7 Middle Finger N N Arm Ext//Wrist Flex C6-7 N N  C8 4th & 5th Finger N Ab Flex/ Ext Carpi Ulnaris C8 N N  T1 Medial Arm N N Interossei T1 N N  L2 Medial thigh/groin N N Illiopsoas (L2-3) N N  L3 Lower thigh/med.knee N N Quadriceps (L3-4) N N  L4 Medial leg/lat thigh N N Tibialis Ant (L4-5) N N  L5 Lat. leg & dorsal foot N N EHL (L5) N N  S1 post/lat foot/thigh/leg N Ab Gastrocnemius (S1-2) N N  S2 Post./med. thigh & leg N N Hamstrings (L4-S3)  N N  Patient reports numbness/tingling in 5th digit and lateral foot on L extremity.  Cranial Nerves Visual acuity and visual fields are intact  Extraocular muscles are intact  Facial sensation is intact bilaterally  Facial strength is intact bilaterally  Hearing is normal as tested by gross conversation Did not test palate, shoulder shrug, or tongue protrusion.   COORDINATION: Finger to Nose: Normal Heel to Shin: Normal, hard to assess in sitting due to limited ROM in position Pronator Drift: Negative Rapid Alternating Movements: Normal Finger to Thumb Opposition: Normal  MUSCULOSKELETAL SCREEN: Cervical Spine ROM: WFL in all planes, mildly painful in L sidebending. No gross deficits identified   ROM: WFL  MMT: WFL  Functional Mobility: WFL  Gait: Scanning of visual environment with gait is normal.   POSTURAL CONTROL TESTS:   Clinical Test of Sensory Interaction for Balance (CTSIB): Deferred  OCULOMOTOR / VESTIBULAR TESTING:  Oculomotor Exam- Room Light  Findings Comments  Ocular Alignment normal   Ocular ROM normal   Spontaneous Nystagmus normal   Gaze-Holding Nystagmus abnormal Appears to have R horizontal beating nystagmus 3-4 beats at R midrange gaze and L horizontal beating nystagmus 3-4 beats at L midrange gaze  End-Gaze  Nystagmus normal   Vergence (normal 2-3") not examined   Smooth Pursuit normal   Cross-Cover Test not examined   Saccades normal   VOR Cancellation normal   Left Head Impulse abnormal Corrective saccades noted  Right Head Impulse normal   Static Acuity not examined   Dynamic Acuity not examined     Oculomotor Exam- Fixation Suppressed: Deferred  BPPV TESTS:  Symptoms Duration Intensity Nystagmus  L Dix-Hallpike Mild dizziness? 3-5s  Difficult to assess due to eyes fluttering and therapist having to hold eyelids open  R Dix-Hallpike Mild dizziness? 3-5s  Difficult to assess due to eyes fluttering and therapist having to hold eyelids open  L Head Roll None   None  R Head Roll None   None   FUNCTIONAL OUTCOME MEASURES   Results Comments  DGI 23/24 WNL  ABC Scale 50% Below cut-off  DHI 40/100 Moderate perception of disability  FOTO 67 Predicted increase to 77       Objective measurements completed on examination: See above findings.       Plan - 06/02/20 1314    Personal Factors and Comorbidities Transportation;Comorbidity 3+    Comorbidities neontal abstinence syndrome, asthma, anxiety, obesity    Examination-Activity Limitations Locomotion Level;Reach Overhead;Continence    Examination-Participation Restrictions Shop;Community Activity;Driving    Stability/Clinical Decision Making Evolving/Moderate complexity    Rehab Potential Fair    PT Frequency 1x / week    PT Duration 8 weeks    PT Treatment/Interventions ADLs/Self Care Home Management;Aquatic Therapy;Canalith Repostioning;Electrical Stimulation;Gait training;Stair training;Functional mobility training;Therapeutic activities;Therapeutic exercise;Balance training;Neuromuscular re-education;Manual techniques;Patient/family education;Vestibular;Traction;Joint Manipulations    PT Next Visit Plan Will create HEP next visit, fixation suppression testing, DVA, mCTSIB    PT Home Exercise Plan None currently    Consulted  and Agree with Plan of Care Patient               Patient will benefit from skilled therapeutic intervention in order to improve the following deficits and impairments:  Dizziness, Decreased activity tolerance, Decreased balance  Visit Diagnosis: Dizziness and giddiness     Problem List There are no problems to display for this patient.  This entire session was performed under direct supervision and direction of a licensed therapist/therapist assistant . I have personally read, edited  and approve of the note as written.   Katherine Basset, SPT Lynnea Maizes PT, DPT, GCS  Huprich,Jason 05/13/2020, 4:09 PM  [place] [facility] MAIN [facility] SERVICES [address] [place], [place], [address] Phone: [phone]   Fax:  [phone]  Name: Erica Webster MRN: [id number] Date of Birth: 04-06-1986

## 2020-05-19 ENCOUNTER — Ambulatory Visit: Payer: Medicare Other

## 2020-05-19 ENCOUNTER — Other Ambulatory Visit: Payer: Self-pay

## 2020-05-19 DIAGNOSIS — R42 Dizziness and giddiness: Secondary | ICD-10-CM

## 2020-05-19 NOTE — Therapy (Signed)
[place] [facility] MAIN [facility] SERVICES [address] [place], [place], [address] Phone: [phone]   Fax:  [phone]  Physical Therapy Treatment  Patient Details  Name: Erica Webster MRN: [id number] Date of Birth: 1985/10/25 Referring Provider (PT): Dr. Alinda Dooms   Encounter Date: 05/19/2020   PT End of Session - 05/19/20 1114    Visit Number 2    Number of Visits 9    Date for PT Re-Evaluation 07/07/20    Authorization Type Eval: 05/12/20    PT Start Time 1110    PT Stop Time 1150    PT Time Calculation (min) 40 min    Equipment Utilized During Treatment Gait belt    Activity Tolerance Patient tolerated treatment well    Behavior During Therapy [facility] for tasks assessed/performed;Flat affect           Past Medical History:  Diagnosis Date  . Asthma   . Depression     Past Surgical History:  Procedure Laterality Date  . TOENAIL EXCISION      There were no vitals filed for this visit.   Subjective Assessment - 05/19/20 1055    Subjective Pt reports that she feels a lot better since the initial evaluation. She had no episodes of dizziness over the course of the last week. She has been walking around [place] for exercise. Denies any pain upon arrival today.    Pertinent History The episodes started in early June. She describes her dizziness as the room is spinning, which lasts for less than a minute, then goes away. Sharp movements to right or left, abrupt stops, and moving too fast aggravate her symptoms. Laying down and resting helps to ease the symptoms. She has an episode about 2x/week, but her last one was July 29th when she was at her adult day program. When she came home, she rested and when she woke up her symptoms were gone. She hasn't had an episodes since then. When she was younger, she used to get dizzy when standing up really fast from sitting. No falls, but a couple of near stumbles. When she gets out of the shower,  she has to hold onto the wall firmly so she doesn't fall. Since it has started in June, her symptoms have gotten better. She has a history of migraines but hasn't had one since she was 13 or 14. She was prescribed Meclizine for the dizziness but stopped taking due to nausea. That is the only recent medication change. Patient had ear pain, itchiness, and hearing loss in both ears due to ear wax build up that is now resolved. Her last hearing test was normal. She denies any vision changes. She notes leakage after urinating which has started recently. No other red flags. She lives by herself but goes to an adult day program 3x/week since 2015. 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 She likes to shop, cook, talk to family and friends, watch TV, and listen to music.    Limitations Walking    Patient Stated Goals Decrease dizziness    Currently in Pain? No/denies                 TREATMENT    Neuromuscular Re-education  Dix Hallpike negative bilaterally;  Oculomotor Exam- Fixation Suppressed  Findings Comments  Ocular Alignment normal   Spontaneous Nystagmus normal   Gaze-Holding Nystagmus normal   End-Gaze Nystagmus normal   Head Shaking Nystagmus normal   Pressure-Induced Nystagmus not examined  Hyperventilation Induced Nystagmus not examined   Skull Vibration Induced Nystagmus not examined      MCTSIB: 30s in all conditions, 2+ sway in condition 4; Airex alternating 6" step taps without UE support x 10 BLE; Airex NBOS basketball toss x multiple bouts; Airex NBOS ball passes around body with therapist varying height and distance from body with head/eye follow x multiple bouts to each side; Airex NBOS balloon tosses with therapist varying distance and direction from body x multiple bouts; VOR x 1 horizontal in sitting x 60s, pt denies dizziness; VOR x 1 horizontal in standing 60s x 2, pt denies dizziness (issued for HEP)   Pt educated throughout session about  proper posture and technique with exercises. Improved exercise technique, movement at target joints, use of target muscles after min to mod verbal, visual, tactile cues.    Pt demonstrates excellent motivation during session. She reports that she is feeling better and has not had any further bouts of dizziness. Dix-Hallpike testing repeated today which is negative bilaterally. Performed fixation suppressed oculmotor/vestibular testing today which is all negative. Unable to reproduce any dizziness today during session. Initiated VOR x 1 horizontal in standing for HEP. Pt encouraged to follow-up as scheduled. If she remains asymptomatic at next visit will consider discharge. Pt will benefit from PT services to address deficits in dizziness and balance in order to return to full function at home.                      PT Short Term Goals - 05/13/20 1435      PT SHORT TERM GOAL #1   Title Pt will be independent with HEP in order to improve strength and balance in order to decrease fall risk and improve function at home and work.    Baseline 05/12/20: Will create HEP next visit    Time 4    Period Weeks    Status New    Target Date 06/09/20             PT Long Term Goals - 05/13/20 1436      PT LONG TERM GOAL #1   Title Pt will improve ABC by at least 13% in order to demonstrate clinically significant improvement in balance confidence.    Baseline 05/12/20: 50%    Time 8    Period Weeks    Status New    Target Date 07/07/20      PT LONG TERM GOAL #2   Title Pt will decrease DHI score by at least 18 points in order to demonstrate clinically significant reduction in disability    Baseline 05/12/20: 40    Time 8    Period Weeks    Status New    Target Date 07/07/20      PT LONG TERM GOAL #3   Title Pt will increase FOTO to at least a 77 in order to demonstrate clinically significant improvement in function at home.    Baseline 05/12/20: 67    Time 8    Period Weeks     Status New    Target Date 07/07/20                 Plan - 05/19/20 1114    Clinical Impression Statement Pt demonstrates excellent motivation during session. She reports that she is feeling better and has not had any further bouts of dizziness. Dix-Hallpike testing repeated today which is negative bilaterally. Performed fixation suppressed oculmotor/vestibular testing today which is  all negative. Unable to reproduce any dizziness today during session. Initiated VOR x 1 horizontal in standing for HEP. Pt encouraged to follow-up as scheduled. If she remains asymptomatic at next visit will consider discharge. Pt will benefit from PT services to address deficits in dizziness and balance in order to return to full function at home.    Personal Factors and Comorbidities Transportation;Comorbidity 3+    Comorbidities neontal abstinence syndrome, asthma, anxiety, obesity    Examination-Activity Limitations Locomotion Level;Reach Overhead;Continence    Examination-Participation Restrictions Shop;Community Activity;Driving    Stability/Clinical Decision Making Evolving/Moderate complexity    Rehab Potential Fair    PT Frequency 1x / week    PT Duration 8 weeks    PT Treatment/Interventions ADLs/Self Care Home Management;Aquatic Therapy;Canalith Repostioning;Electrical Stimulation;Gait training;Stair training;Functional mobility training;Therapeutic activities;Therapeutic exercise;Balance training;Neuromuscular re-education;Manual techniques;Patient/family education;Vestibular;Traction;Joint Manipulations    PT Next Visit Plan Continue with adaption, habituation, and balance exercises. If no further dizziness can consider discharge.    PT Home Exercise Plan Access Code: [email or web address]    Consulted and Agree with Plan of Care Patient           Patient will benefit from skilled therapeutic intervention in order to improve the following deficits and impairments:  Dizziness, Decreased activity tolerance,  Decreased balance  Visit Diagnosis: Dizziness and giddiness     Problem List There are no problems to display for this patient.  Lynnea Maizes PT, DPT, GCS  Javae Braaten 05/19/2020, 2:53 PM  [place] [facility] MAIN [facility] SERVICES [address] [place], [place], [address] Phone: [phone]   Fax:  [phone]  Name: Erica Webster MRN: [id number] Date of Birth: 21-Dec-1985

## 2020-05-19 NOTE — Patient Instructions (Addendum)
Access Code: [email or web address] URL: [email or web address] Date: 05/19/2020 Prepared by: Ria Comment  Exercises Standing Gaze Stabilization with Head Rotation - 4 x daily - 7 x weekly - 3 reps - 60 seconds hold

## 2020-05-26 ENCOUNTER — Ambulatory Visit: Payer: Medicare Other

## 2020-06-02 ENCOUNTER — Ambulatory Visit: Payer: Medicare Other

## 2020-06-02 ENCOUNTER — Other Ambulatory Visit: Payer: Self-pay

## 2020-06-02 DIAGNOSIS — R42 Dizziness and giddiness: Secondary | ICD-10-CM

## 2020-06-02 NOTE — Therapy (Signed)
[place] MAIN [facility] SERVICES [address] [place], [place], [address] Phone: [phone]   Fax:  [phone]  Physical Therapy Treatment/DC  Patient Details  Name: Erica Webster MRN: [id number] Date of Birth: 1986-08-03 Referring Provider (PT): Dr. Kalman Drape   Encounter Date: 06/02/2020   PT End of Session - 06/02/20 1120    Visit Number 3    Number of Visits 9    Date for PT Re-Evaluation 07/07/20    Authorization Type Eval: 05/12/20    PT Start Time 1115   Pt arrived pt via transportation service   PT Stop Time 1145    PT Time Calculation (min) 30 min    Activity Tolerance Patient tolerated treatment well;No increased pain    Behavior During Therapy [facility] for tasks assessed/performed;Flat affect           Past Medical History:  Diagnosis Date  . Asthma   . Depression     Past Surgical History:  Procedure Laterality Date  . TOENAIL EXCISION      There were no vitals filed for this visit.   Subjective Assessment - 06/02/20 1118    Subjective Pt doing well this date. She reports working on VOR exercises at home with success. She denies any dizziness episodes since starting PT. No medical or medication updates.    Pertinent History The episodes started in early June. She describes her dizziness as the room is spinning, which lasts for less than a minute, then goes away. Sharp movements to right or left, abrupt stops, and moving too fast aggravate her symptoms. Laying down and resting helps to ease the symptoms. She has an episode about 2x/week, but her last one was July 29th when she was at her adult day program. When she came home, she rested and when she woke up her symptoms were gone. She hasn't had an episodes since then. When she was younger, she used to get dizzy when standing up really fast from sitting. No falls, but a couple of near Boomer. When she gets out of the shower, she has to hold onto the wall firmly so she  doesn't fall. Since it has started in June, her symptoms have gotten better. She has a history of migraines but hasn't had one since she was 39 or 14. She was prescribed Meclizine for the dizziness but stopped taking due to nausea. That is the only recent medication change. Patient had ear pain, itchiness, and hearing loss in both ears due to ear wax build up that is now resolved. Her last hearing test was normal. She denies any vision changes. She notes leakage after urinating which has started recently. No other red flags. She lives by herself but goes to an adult day program 3x/week since 2015. 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 She likes to shop, cook, talk to family and friends, watch TV, and listen to music.    Currently in Pain? No/denies           INTERVENTION THIS DATE: -beginning session vitals (left forearm)  Seated 128/75 99 BPM  Standing 130/77 106bpm (no symptoms)  -standing VOR 3x30sec, cues to increase speed within tolerance, seated eyes closed deep breathing x 60sec between.  *no dizziness -standing vertical head turns/eye tracking to sprinkler head x20 (no dizzies)  -standing cued head turns and visual target finding cued 5 Post-It notes on wall, eye level, letters A-E positioned at 0 degrees, 45 degrees and 90 degrees bilat. Pt cued  to turn head and affix gaze to letter as cued. X60sec, no difficulty with balance or tracking.  -4x10 meters FWD walking, head turns as cued (no dizzines)  -2x10 meters FWD walking with ball self-toss/catch -1x10 meters side stepping with ball self-toss/catch       PT Short Term Goals - 06/02/20 1217      PT SHORT TERM GOAL #1   Title Pt will be independent with HEP in order to improve strength and balance in order to decrease fall risk and improve function at home and work.    Baseline 05/12/20: Will create HEP next visit    Time 4    Period Weeks    Status Achieved    Target Date 06/09/20             PT Long Term  Goals - 06/02/20 1219      PT LONG TERM GOAL #1   Title Pt will improve ABC by at least 13% in order to demonstrate clinically significant improvement in balance confidence.    Baseline 05/12/20: 50%    Time 8    Period Weeks    Status Deferred    Target Date 07/07/20      PT LONG TERM GOAL #2   Title Pt will decrease DHI score by at least 18 points in order to demonstrate clinically significant reduction in disability    Baseline 05/12/20: 40    Time 8    Period Weeks    Status Deferred    Target Date 07/07/20      PT LONG TERM GOAL #3   Title Pt will increase FOTO to at least a 77 in order to demonstrate clinically significant improvement in function at home.    Baseline 05/12/20: 67; 74 at visit 3    Time 8    Period Weeks    Status Partially Met    Target Date 07/07/20                 Plan - 06/02/20 1120    Clinical Impression Statement Conitnued with adaption, habituation, and balance exercises. Pt has not had any dizziness/vertigo issues since starting PT, nor in session. Pt as to continue to work with VOR HEP for 2-3 more weeks. Pt encouraged to contact office if she has any further exacerbation of dizziness. Pt will be DC from PT services.    Personal Factors and Comorbidities Transportation;Comorbidity 3+    Comorbidities neontal abstinence syndrome, asthma, anxiety, obesity    Examination-Activity Limitations Locomotion Level;Reach Overhead;Continence    Examination-Participation Restrictions Shop;Community Activity;Driving    Stability/Clinical Decision Making Evolving/Moderate complexity    Clinical Decision Making Low    Rehab Potential Fair    PT Frequency 1x / week    PT Duration 8 weeks    PT Treatment/Interventions ADLs/Self Care Home Management;Aquatic Therapy;Canalith Repostioning;Electrical Stimulation;Gait training;Stair training;Functional mobility training;Therapeutic activities;Therapeutic exercise;Balance training;Neuromuscular re-education;Manual  techniques;Patient/family education;Vestibular;Traction;Joint Manipulations    PT Next Visit Plan Continue with adaption, habituation, and balance exercises. If no further dizziness can consider discharge.    PT Home Exercise Plan Access Code: [email or web address]    Consulted and Agree with Plan of Care Patient           Patient will benefit from skilled therapeutic intervention in order to improve the following deficits and impairments:  Dizziness, Decreased activity tolerance, Decreased balance  Visit Diagnosis: Dizziness and giddiness     Problem List There are no problems to display for this patient.  12:22 PM,  06/02/20 Etta Grandchild, PT, DPT Physical Therapist - [facility]  Outpatient Physical Therapy- [place] [phone]     Etta Grandchild 06/02/2020, 12:22 PM  [place] MAIN [facility] SERVICES [address] [place], [place], [address] Phone: [phone]   Fax:  [phone]  Name: Mckaylin Bastien MRN: [id number] Date of Birth: 1985-10-09

## 2020-07-07 ENCOUNTER — Encounter: Payer: Medicare Other | Admitting: Physical Therapy

## 2020-10-01 ENCOUNTER — Ambulatory Visit: Payer: Medicare Other | Attending: Neurology

## 2020-10-01 DIAGNOSIS — G4733 Obstructive sleep apnea (adult) (pediatric): Secondary | ICD-10-CM | POA: Diagnosis present

## 2020-10-04 ENCOUNTER — Other Ambulatory Visit: Payer: Self-pay

## 2020-10-27 ENCOUNTER — Other Ambulatory Visit: Payer: Self-pay

## 2020-10-27 ENCOUNTER — Emergency Department
Admission: EM | Admit: 2020-10-27 | Discharge: 2020-10-27 | Disposition: A | Discharge status: Home / Self Care | Payer: Medicare Other | Attending: Emergency Medicine | Admitting: Emergency Medicine

## 2020-10-27 DIAGNOSIS — R1010 Upper abdominal pain, unspecified: Secondary | ICD-10-CM | POA: Insufficient documentation

## 2020-10-27 DIAGNOSIS — Z7951 Long term (current) use of inhaled steroids: Secondary | ICD-10-CM | POA: Insufficient documentation

## 2020-10-27 DIAGNOSIS — R197 Diarrhea, unspecified: Secondary | ICD-10-CM | POA: Insufficient documentation

## 2020-10-27 DIAGNOSIS — J45909 Unspecified asthma, uncomplicated: Secondary | ICD-10-CM | POA: Insufficient documentation

## 2020-10-27 LAB — COMPREHENSIVE METABOLIC PANEL
ALT: 17 U/L (ref 0–44)
AST: 15 U/L (ref 15–41)
Albumin: 4.2 g/dL (ref 3.5–5.0)
Alkaline Phosphatase: 104 U/L (ref 38–126)
Anion gap: 12 (ref 5–15)
BUN: 12 mg/dL (ref 6–20)
CO2: 26 mmol/L (ref 22–32)
Calcium: 9 mg/dL (ref 8.9–10.3)
Chloride: 100 mmol/L (ref 98–111)
Creatinine, Ser: 0.98 mg/dL (ref 0.44–1.00)
GFR, Estimated: >60 mL/min (ref >60)
Glucose, Bld: 85 mg/dL (ref 70–99)
Potassium: 4.1 mmol/L (ref 3.5–5.1)
Sodium: 138 mmol/L (ref 135–145)
Total Bilirubin: 0.6 mg/dL (ref 0.3–1.2)
Total Protein: 8.6 g/dL — ABNORMAL HIGH (ref 6.5–8.1)

## 2020-10-27 LAB — URINALYSIS, COMPLETE (UACMP) WITH MICROSCOPIC
Bilirubin Urine: NEGATIVE
Glucose, UA: NEGATIVE mg/dL
Hgb urine dipstick: NEGATIVE
Ketones, ur: NEGATIVE mg/dL
Leukocytes,Ua: NEGATIVE
Nitrite: NEGATIVE
Protein, ur: NEGATIVE mg/dL
Specific Gravity, Urine: 1.001 — ABNORMAL LOW (ref 1.005–1.030)
Squamous Epithelial / LPF: NONE SEEN (ref 0–5)
pH: 6 (ref 5.0–8.0)

## 2020-10-27 LAB — CBC
HCT: 41.8 % (ref 36.0–46.0)
Hemoglobin: 12.5 g/dL (ref 12.0–15.0)
MCH: 25.2 pg — ABNORMAL LOW (ref 26.0–34.0)
MCHC: 29.9 g/dL — ABNORMAL LOW (ref 30.0–36.0)
MCV: 84.1 fL (ref 80.0–100.0)
Platelets: 342 10*3/uL (ref 150–400)
RBC: 4.97 MIL/uL (ref 3.87–5.11)
RDW: 15.6 % — ABNORMAL HIGH (ref 11.5–15.5)
WBC: 5.9 10*3/uL (ref 4.0–10.5)
nRBC: 0 % (ref 0.0–0.2)

## 2020-10-27 LAB — POC URINE PREG, ED: Preg Test, Ur: NEGATIVE

## 2020-10-27 LAB — LIPASE, BLOOD: Lipase: 25 U/L (ref 11–51)

## 2020-10-27 NOTE — ED Provider Notes (Signed)
[facility] Emergency Department Provider Note   ____________________________________________    I have reviewed the triage vital signs and the nursing notes.   HISTORY  Chief Complaint Diarrhea     HPI Erica Webster is a 35 y.o. female with history as noted below who presents with complaints of diarrhea.  Patient describes frequent diarrhea since Thanksgiving.  She reports stool is watery and she does not feel dehydrated but she was sent from PCP for evaluation for possible dehydration.  Has occasional abdominal discomfort primarily in the upper abdomen.  Currently is feeling well.  She reports diarrhea started soon after starting Lamictal.  No recent antibiotic use  Past Medical History:  Diagnosis Date  . Asthma   . Depression     There are no problems to display for this patient.   Past Surgical History:  Procedure Laterality Date  . TOENAIL EXCISION      Prior to Admission medications   Medication Sig Start Date End Date Taking? Authorizing Provider  albuterol (PROVENTIL HFA;VENTOLIN HFA) 108 (90 Base) MCG/ACT inhaler Inhale 2 puffs into the lungs every 4 (four) hours as needed for wheezing or shortness of breath.    [provider]  ARIPiprazole (ABILIFY) 15 MG tablet Take 15 mg by mouth daily.    [provider]  Fluticasone-Salmeterol (ADVAIR) 250-50 MCG/DOSE AEPB Inhale 1 puff into the lungs 2 (two) times daily.    [provider]  ondansetron (ZOFRAN ODT) 4 MG disintegrating tablet Take 1 tablet (4 mg total) by mouth every 8 (eight) hours as needed. 03/02/18   Menshew, Charlesetta Ivory, PA-C  predniSONE (DELTASONE) 50 MG tablet Take one tablet once daily for the next five days. 03/08/19   Orvil Feil, PA-C  Spacer/Aero Chamber Mouthpiece MISC 1 Units by Does not apply route every 4 (four) hours as needed (wheezing). 10/30/17   Merrily Brittle, MD  vitamin B-12 (CYANOCOBALAMIN) 500 MCG tablet Take 500 mcg by  mouth daily.    [provider]     Allergies Shellfish allergy and Penicillins  Family History  Adopted: Yes  Problem Relation Age of Onset  . Breast cancer Neg Hx     Social History Social History   Tobacco Use  . Smoking status: Never Smoker  . Smokeless tobacco: Never Used  Substance Use Topics  . Alcohol use: No  . Drug use: No    Review of Systems  Constitutional: No fever/chills Eyes: No visual changes.  ENT: No sore throat. Cardiovascular: Denies chest pain. Respiratory: Denies shortness of breath. Gastrointestinal: As above Genitourinary: Negative for dysuria. Musculoskeletal: Negative for back pain. Skin: Negative for rash. Neurological: Negative for headaches    ____________________________________________   PHYSICAL EXAM:  VITAL SIGNS: ED Triage Vitals  Enc Vitals Group     BP 10/27/20 1856 123/77     Pulse Rate 10/27/20 1856 (!) 101     Resp 10/27/20 1856 18     Temp 10/27/20 1856 98.7 F (37.1 C)     Temp Source 10/27/20 1856 Oral     SpO2 10/27/20 1856 99 %     Weight 10/27/20 1857 (!) 153.8 kg (339 lb)     Height 10/27/20 1857 1.575 m (5\' 2" )     Head Circumference --      Peak Flow --      Pain Score 10/27/20 1857 8     Pain Loc --      Pain Edu? --  Excl. in GC? --     Constitutional: Alert and oriented. No acute distress.  Nose: No congestion/rhinnorhea. Mouth/Throat: Mucous membranes are moist.    Cardiovascular: Normal rate, regular rhythm. Grossly normal heart sounds.  Good peripheral circulation. Respiratory: Normal respiratory effort.  No retractions. Lungs CTAB. Gastrointestinal: Soft and nontender. No distention.  Reassuring exam  Musculoskeletal: No lower extremity tenderness nor edema.  Warm and well perfused Neurologic:  Normal speech and language. No gross focal neurologic deficits are appreciated.  Skin:  Skin is warm, dry and intact. No rash noted. Psychiatric: Mood and affect are normal. Speech and  behavior are normal.  ____________________________________________   LABS (all labs ordered are listed, but only abnormal results are displayed)  Labs Reviewed  COMPREHENSIVE METABOLIC PANEL - Abnormal; Notable for the following components:      Result Value   Total Protein 8.6 (*)    All other components within normal limits  CBC - Abnormal; Notable for the following components:   MCH 25.2 (*)    MCHC 29.9 (*)    RDW 15.6 (*)    All other components within normal limits  URINALYSIS, COMPLETE (UACMP) WITH MICROSCOPIC - Abnormal; Notable for the following components:   Color, Urine STRAW (*)    APPearance CLEAR (*)    Specific Gravity, Urine 1.001 (*)    Bacteria, UA RARE (*)    All other components within normal limits  LIPASE, BLOOD  POC URINE PREG, ED   ____________________________________________  EKG  None ____________________________________________  RADIOLOGY  None ____________________________________________   PROCEDURES  Procedure(s) performed: No  Procedures   Critical Care performed: No ____________________________________________   INITIAL IMPRESSION / ASSESSMENT AND PLAN / ED COURSE  Pertinent labs & imaging results that were available during my care of the patient were reviewed by me and considered in my medical decision making (see chart for details).  Patient well-appearing in no acute distress, abdominal exam is overall reassuring.  Lab work is unremarkable.  LFTs normal.  Lipase is normal.  BUN/creatinine ratio is normal.  Normal white blood cell count  Not consistent with C. difficile colitis.  Possible functional diarrhea versus medication side effect.  Recommend discussing with psychiatrist whether alternative is available for Lamictal.  Appropriate for discharge at this time with outpatient follow-up.  ____________________________________________   FINAL CLINICAL IMPRESSION(S) / ED DIAGNOSES  Final diagnoses:  Diarrhea,  unspecified type        Note:  This document was prepared using Dragon voice recognition software and may include unintentional dictation errors.   Jene Every, MD 10/27/20 2140

## 2020-10-27 NOTE — ED Notes (Signed)
Unable to obtain blood. Called lab to send phlebotomist.

## 2020-10-27 NOTE — Discharge Instructions (Addendum)
Your diarrhea may be a medication side effect, please discuss switching medications with your psychiatrist

## 2020-10-27 NOTE — ED Triage Notes (Signed)
Pt sent from [facility] today for diarrhea and abd pain. Pt state diarrhea started at thanksgiving. Paperwork with pt states she is being sent to ER to r/o gallbladder problems and dehydration. Did not do any blood work at PCP today.   A&O, ambulatory. No distress noted.

## 2020-12-07 ENCOUNTER — Emergency Department: Payer: Medicare Other

## 2020-12-07 ENCOUNTER — Emergency Department
Admission: EM | Admit: 2020-12-07 | Discharge: 2020-12-07 | Disposition: A | Discharge status: Home / Self Care | Payer: Medicare Other | Attending: Student in an Organized Health Care Education/Training Program | Admitting: Student in an Organized Health Care Education/Training Program

## 2020-12-07 ENCOUNTER — Other Ambulatory Visit: Payer: Self-pay

## 2020-12-07 DIAGNOSIS — J45909 Unspecified asthma, uncomplicated: Secondary | ICD-10-CM | POA: Insufficient documentation

## 2020-12-07 DIAGNOSIS — R1011 Right upper quadrant pain: Secondary | ICD-10-CM | POA: Insufficient documentation

## 2020-12-07 DIAGNOSIS — R197 Diarrhea, unspecified: Secondary | ICD-10-CM | POA: Insufficient documentation

## 2020-12-07 DIAGNOSIS — Z7952 Long term (current) use of systemic steroids: Secondary | ICD-10-CM | POA: Insufficient documentation

## 2020-12-07 LAB — COMPREHENSIVE METABOLIC PANEL
ALT: 18 U/L (ref 0–44)
AST: 17 U/L (ref 15–41)
Albumin: 4 g/dL (ref 3.5–5.0)
Alkaline Phosphatase: 106 U/L (ref 38–126)
Anion gap: 7 (ref 5–15)
BUN: 13 mg/dL (ref 6–20)
CO2: 27 mmol/L (ref 22–32)
Calcium: 8.9 mg/dL (ref 8.9–10.3)
Chloride: 103 mmol/L (ref 98–111)
Creatinine, Ser: 0.93 mg/dL (ref 0.44–1.00)
GFR, Estimated: >60 mL/min (ref >60)
Glucose, Bld: 88 mg/dL (ref 70–99)
Potassium: 4 mmol/L (ref 3.5–5.1)
Sodium: 137 mmol/L (ref 135–145)
Total Bilirubin: 0.7 mg/dL (ref 0.3–1.2)
Total Protein: 7.7 g/dL (ref 6.5–8.1)

## 2020-12-07 LAB — CBC
HCT: 39.1 % (ref 36.0–46.0)
Hemoglobin: 11.6 g/dL — ABNORMAL LOW (ref 12.0–15.0)
MCH: 24.6 pg — ABNORMAL LOW (ref 26.0–34.0)
MCHC: 29.7 g/dL — ABNORMAL LOW (ref 30.0–36.0)
MCV: 83 fL (ref 80.0–100.0)
Platelets: 302 10*3/uL (ref 150–400)
RBC: 4.71 MIL/uL (ref 3.87–5.11)
RDW: 15.5 % (ref 11.5–15.5)
WBC: 6 10*3/uL (ref 4.0–10.5)
nRBC: 0 % (ref 0.0–0.2)

## 2020-12-07 LAB — URINALYSIS, COMPLETE (UACMP) WITH MICROSCOPIC
Bilirubin Urine: NEGATIVE
Glucose, UA: NEGATIVE mg/dL
Ketones, ur: NEGATIVE mg/dL
Nitrite: NEGATIVE
Protein, ur: NEGATIVE mg/dL
Specific Gravity, Urine: 1.026 (ref 1.005–1.030)
pH: 5 (ref 5.0–8.0)

## 2020-12-07 LAB — LIPASE, BLOOD: Lipase: 24 U/L (ref 11–51)

## 2020-12-07 LAB — POC URINE PREG, ED: Preg Test, Ur: NEGATIVE

## 2020-12-07 MED ORDER — IOHEXOL 300 MG/ML  SOLN
100.0000 mL | Freq: Once | INTRAMUSCULAR | Status: AC | PRN
  Administered 2020-12-07: 100 mL via INTRAVENOUS
  Filled 2020-12-07: qty 100

## 2020-12-07 MED ORDER — KETOROLAC TROMETHAMINE 30 MG/ML IJ SOLN
15.0000 mg | Freq: Once | INTRAMUSCULAR | Status: AC
  Administered 2020-12-07: 15 mg via INTRAVENOUS
  Filled 2020-12-07: qty 1

## 2020-12-07 MED ORDER — DICYCLOMINE HCL 10 MG PO CAPS: 10.0000 mg | ORAL_CAPSULE | Freq: Four times a day (QID) | ORAL | 0 refills | Status: DC

## 2020-12-07 NOTE — ED Provider Notes (Signed)
[facility] Emergency Department Provider Note    Event Date/Time   First MD Initiated Contact with Patient 12/07/20 1943     (approximate)  I have reviewed the triage vital signs and the nursing notes.   HISTORY  Chief Complaint Abdominal Pain    HPI Erica Webster is a 35 y.o. female with the below listed past medical history presents to the ER for evaluation of generalized periumbilical abdominal pain. Has been having moments of watery nonbloody diarrhea. Is never had pain like this before. Does have family history of ulcerative colitis. Denies any nausea or vomiting. No measured fevers. No previous surgeries. States the pain is mild to moderate.    Past Medical History:  Diagnosis Date  . Asthma   . Depression    Family History  Adopted: Yes  Problem Relation Age of Onset  . Breast cancer Neg Hx    Past Surgical History:  Procedure Laterality Date  . TOENAIL EXCISION     There are no problems to display for this patient.     Prior to Admission medications   Medication Sig Start Date End Date Taking? Authorizing Provider  albuterol (PROVENTIL HFA;VENTOLIN HFA) 108 (90 Base) MCG/ACT inhaler Inhale 2 puffs into the lungs every 4 (four) hours as needed for wheezing or shortness of breath.    [provider]  ARIPiprazole (ABILIFY) 15 MG tablet Take 15 mg by mouth daily.    [provider]  Fluticasone-Salmeterol (ADVAIR) 250-50 MCG/DOSE AEPB Inhale 1 puff into the lungs 2 (two) times daily.    [provider]  ondansetron (ZOFRAN ODT) 4 MG disintegrating tablet Take 1 tablet (4 mg total) by mouth every 8 (eight) hours as needed. 03/02/18   Menshew, Charlesetta Ivory, PA-C  predniSONE (DELTASONE) 50 MG tablet Take one tablet once daily for the next five days. 03/08/19   Orvil Feil, PA-C  Spacer/Aero Chamber Mouthpiece MISC 1 Units by Does not apply route every 4 (four) hours as needed (wheezing). 10/30/17   Merrily Brittle, MD  vitamin B-12 (CYANOCOBALAMIN) 500 MCG tablet Take 500 mcg by mouth daily.    [provider]    Allergies Shellfish allergy and Penicillins    Social History Social History   Tobacco Use  . Smoking status: Never Smoker  . Smokeless tobacco: Never Used  Substance Use Topics  . Alcohol use: No  . Drug use: No    Review of Systems Patient denies headaches, rhinorrhea, blurry vision, numbness, shortness of breath, chest pain, edema, cough, abdominal pain, nausea, vomiting, diarrhea, dysuria, fevers, rashes or hallucinations unless otherwise stated above in HPI. ____________________________________________   PHYSICAL EXAM:  VITAL SIGNS: Vitals:   12/07/20 1852  BP: 139/90  Pulse: (!) 107  Resp: 16  Temp: 98.4 F (36.9 C)  SpO2: 99%    Constitutional: Alert and oriented.  Eyes: Conjunctivae are normal.  Head: Atraumatic. Nose: No congestion/rhinnorhea. Mouth/Throat: Mucous membranes are moist.   Neck: No stridor. Painless ROM.  Cardiovascular: Normal rate, regular rhythm. Grossly normal heart sounds.  Good peripheral circulation. Respiratory: Normal respiratory effort.  No retractions. Lungs CTAB. Gastrointestinal: Soft with diffuse ttp,  No guarding or rebound. No distention. No abdominal bruits. No CVA tenderness. Genitourinary:  Musculoskeletal: No lower extremity tenderness nor edema.  No joint effusions. Neurologic:  Normal speech and language. No gross focal neurologic deficits are appreciated. No facial droop Skin:  Skin is warm, dry and intact. No rash noted. Psychiatric: Mood and affect  are normal. Speech and behavior are normal.  ____________________________________________   LABS (all labs ordered are listed, but only abnormal results are displayed)  Results for orders placed or performed during the hospital encounter of 12/07/20 (from the past 24 hour(s))  Urinalysis, Complete w Microscopic     Status: Abnormal   Collection Time:  12/07/20  7:17 PM  Result Value Ref Range   Color, Urine YELLOW (A) YELLOW   APPearance CLOUDY (A) CLEAR   Specific Gravity, Urine 1.026 1.005 - 1.030   pH 5.0 5.0 - 8.0   Glucose, UA NEGATIVE NEGATIVE mg/dL   Hgb urine dipstick MODERATE (A) NEGATIVE   Bilirubin Urine NEGATIVE NEGATIVE   Ketones, ur NEGATIVE NEGATIVE mg/dL   Protein, ur NEGATIVE NEGATIVE mg/dL   Nitrite NEGATIVE NEGATIVE   Leukocytes,Ua SMALL (A) NEGATIVE   RBC / HPF 0-5 0 - 5 RBC/hpf   WBC, UA 6-10 0 - 5 WBC/hpf   Bacteria, UA FEW (A) NONE SEEN   Squamous Epithelial / LPF 11-20 0 - 5   Mucus PRESENT   Lipase, blood     Status: None   Collection Time: 12/07/20  7:43 PM  Result Value Ref Range   Lipase 24 11 - 51 U/L  Comprehensive metabolic panel     Status: None   Collection Time: 12/07/20  7:43 PM  Result Value Ref Range   Sodium 137 135 - 145 mmol/L   Potassium 4.0 3.5 - 5.1 mmol/L   Chloride 103 98 - 111 mmol/L   CO2 27 22 - 32 mmol/L   Glucose, Bld 88 70 - 99 mg/dL   BUN 13 6 - 20 mg/dL   Creatinine, Ser 0.53 0.44 - 1.00 mg/dL   Calcium 8.9 8.9 - 97.6 mg/dL   Total Protein 7.7 6.5 - 8.1 g/dL   Albumin 4.0 3.5 - 5.0 g/dL   AST 17 15 - 41 U/L   ALT 18 0 - 44 U/L   Alkaline Phosphatase 106 38 - 126 U/L   Total Bilirubin 0.7 0.3 - 1.2 mg/dL   GFR, Estimated >73 >41 mL/min   Anion gap 7 5 - 15  CBC     Status: Abnormal   Collection Time: 12/07/20  7:43 PM  Result Value Ref Range   WBC 6.0 4.0 - 10.5 K/uL   RBC 4.71 3.87 - 5.11 MIL/uL   Hemoglobin 11.6 (L) 12.0 - 15.0 g/dL   HCT 93.7 90.2 - 40.9 %   MCV 83.0 80.0 - 100.0 fL   MCH 24.6 (L) 26.0 - 34.0 pg   MCHC 29.7 (L) 30.0 - 36.0 g/dL   RDW 73.5 32.9 - 92.4 %   Platelets 302 150 - 400 K/uL   nRBC 0.0 0.0 - 0.2 %  POC urine preg, ED     Status: None   Collection Time: 12/07/20  8:24 PM  Result Value Ref Range   Preg Test, Ur NEGATIVE NEGATIVE    ____________________________________________ ____________________________________________  RADIOLOGY  I personally reviewed all radiographic images ordered to evaluate for the above acute complaints and reviewed radiology reports and findings.  These findings were personally discussed with the patient.  Please see medical record for radiology report.  ____________________________________________   PROCEDURES  Procedure(s) performed:  Procedures    Critical Care performed: no ____________________________________________   INITIAL IMPRESSION / ASSESSMENT AND PLAN / ED COURSE  Pertinent labs & imaging results that were available during my care of the patient were reviewed by me and considered in my  medical decision making (see chart for details).   DDX: Enteritis, colitis, IBD, appendicitis, diverticulitis, abscess, cyst, musculoskeletal strain, hernia  Natsumi Whitsitt is a 35 y.o. who presents to the ED with symptoms as described above.  Patient presenting for new development of generalized abdominal pain.  Primarily epigastric she describes.  She is tender on exam will order CT imaging.  Blood work is reassuring.  Clinical Course as of 12/07/20 2125  Mon Dec 07, 2020  2124 CT imaging does show gallstones.  Biliary labs normal no white count but patient is tender in the right upper quadrant therefore will order ultrasound to further evaluate.  If if negative or not showing any signs of acute cholecystitis will be appropriate for close outpatient follow-up.  Patient agreeable to plan.  Patient be signed out to oncoming physician pending ultrasound. [PR]    Clinical Course User Index [PR] Willy Eddy, MD    The patient was evaluated in Emergency Department today for the symptoms described in the history of present illness. He/she was evaluated in the context of the global COVID-19 pandemic, which necessitated consideration that the patient might be at risk for infection  with the SARS-CoV-2 virus that causes COVID-19. Institutional protocols and algorithms that pertain to the evaluation of patients at risk for COVID-19 are in a state of rapid change based on information released by regulatory bodies including the CDC and federal and state organizations. These policies and algorithms were followed during the patient's care in the ED.  As part of my medical decision making, I reviewed the following data within the electronic MEDICAL RECORD NUMBER Nursing notes reviewed and incorporated, Labs reviewed, notes from prior ED visits and [place] Controlled Substance Database   ____________________________________________   FINAL CLINICAL IMPRESSION(S) / ED DIAGNOSES  Final diagnoses:  RUQ abdominal pain      NEW MEDICATIONS STARTED DURING THIS VISIT:  New Prescriptions   No medications on file     Note:  This document was prepared using Dragon voice recognition software and may include unintentional dictation errors.    Willy Eddy, MD 12/07/20 2125

## 2020-12-07 NOTE — ED Triage Notes (Signed)
Pt states she has had lower abd pain for the past 3 days. Pt states pain started with her period but this pain has been different than normal. Pt states she has had diarrhea that started last November. Pt has seen GI and is scheduled for a colonoscopy next month.

## 2020-12-10 ENCOUNTER — Other Ambulatory Visit: Payer: Self-pay

## 2020-12-10 ENCOUNTER — Encounter: Payer: Self-pay | Admitting: Surgery

## 2020-12-10 ENCOUNTER — Ambulatory Visit (INDEPENDENT_AMBULATORY_CARE_PROVIDER_SITE_OTHER): Payer: Medicare Other | Admitting: Surgery

## 2020-12-10 VITALS — BP 168/102 | HR 101 | Temp 98.1°F | Ht 62.0 in | Wt 341.8 lb

## 2020-12-10 DIAGNOSIS — K8010 Calculus of gallbladder with chronic cholecystitis without obstruction: Secondary | ICD-10-CM | POA: Diagnosis not present

## 2020-12-10 NOTE — Progress Notes (Signed)
Patient ID: Erica Webster, female   DOB: 02-22-1986, 35 y.o.   MRN: [id number]  Chief Complaint: Gallstones  History of Present Illness Erica Webster is a 35 y.o. female with recent diagnosis of gallstones.  She reports her abdominal pain is a 3 to a 5, it begins in the right upper quadrant but seems to extend across the central abdomen and into the left flank.  She reports the pain is worse that night after her meal.  She is aware of the foods she needs to avoid.  She denies any history of fevers.  She reports nausea, and has vomited before.  She states her bowel movements are normal.  She apparently lives alone, moving from [place] after her parents died.  She reports she has an uncle and a cousin living in the area.  But even since her ED visit, she has not heard back from her cousin.  Past Medical History Past Medical History:  Diagnosis Date  . Asthma   . Depression       Past Surgical History:  Procedure Laterality Date  . TOENAIL EXCISION      Allergies  Allergen Reactions  . Shellfish Allergy Anaphylaxis  . Penicillins Itching    Current Outpatient Medications  Medication Sig Dispense Refill  . albuterol (PROVENTIL HFA;VENTOLIN HFA) 108 (90 Base) MCG/ACT inhaler Inhale 2 puffs into the lungs every 4 (four) hours as needed for wheezing or shortness of breath.    . ARIPiprazole (ABILIFY) 15 MG tablet Take 15 mg by mouth daily.    Marland Kitchen dicyclomine (BENTYL) 10 MG capsule Take 1 capsule (10 mg total) by mouth 4 (four) times daily for 14 days. 56 capsule 0  . Fluticasone-Salmeterol (ADVAIR) 250-50 MCG/DOSE AEPB Inhale 1 puff into the lungs 2 (two) times daily.    Marland Kitchen Spacer/Aero Chamber Mouthpiece MISC 1 Units by Does not apply route every 4 (four) hours as needed (wheezing). 1 each 0  . vitamin B-12 (CYANOCOBALAMIN) 500 MCG tablet Take 500 mcg by mouth daily.     No current facility-administered medications for this visit.    Family History Family History  Adopted: Yes   Problem Relation Age of Onset  . Breast cancer Neg Hx       Social History Social History   Tobacco Use  . Smoking status: Never Smoker  . Smokeless tobacco: Never Used  Substance Use Topics  . Alcohol use: No  . Drug use: No       Review of Systems  Constitutional: Positive for chills, fever, malaise/fatigue and weight loss.  HENT: Negative.   Eyes: Negative.   Respiratory: Positive for shortness of breath and wheezing.   Cardiovascular: Positive for leg swelling.  Gastrointestinal: Positive for abdominal pain, constipation, diarrhea, nausea and vomiting.  Genitourinary: Positive for frequency and urgency.  Skin: Positive for itching.  Neurological: Positive for dizziness and speech change.  Psychiatric/Behavioral: Positive for depression.      Physical Exam Blood pressure (!) 168/102, pulse (!) 101, temperature 98.1 F (36.7 C), temperature source Oral, height 5\' 2"  (1.575 m), weight (!) 341 lb 12.8 oz (155 kg), last menstrual period 11/30/2020, SpO2 96 %. Last Weight  Most recent update: 12/10/2020  3:21 PM   Weight  155 kg (341 lb 12.8 oz)              CONSTITUTIONAL: Well developed, and nourished, appropriately responsive and aware without distress.  Morbidly obese. EYES: Sclera non-icteric.   EARS, NOSE, MOUTH AND THROAT: Mask worn.  The oropharynx is clear. Oral mucosa is pink and moist.    Hearing is intact to voice.  NECK: Trachea is midline, and there is no jugular venous distension.  LYMPH NODES:  Lymph nodes in the neck are not appreciable. RESPIRATORY:  Lungs are clear, and breath sounds are equal bilaterally. Normal respiratory effort without pathologic use of accessory muscles. CARDIOVASCULAR: Heart is regular in rate and rhythm. GI: The abdomen is morbidly obese, soft, nontender, and nondistended. There were no palpable masses. I did not appreciate hepatosplenomegaly. There were normal bowel sounds.  MUSCULOSKELETAL:  Symmetrical muscle tone  appreciated in all four extremities.    SKIN: Skin turgor is normal. No pathologic skin lesions appreciated.  NEUROLOGIC:  Motor and sensation appear grossly normal.  Cranial nerves are grossly without defect. PSYCH:  Alert and oriented to person, place and time. Affect is appropriate for situation.  I made a deliberate effort to simplify all the conversation to ensure adequate understanding of her choices and options.  Data Reviewed I have personally reviewed what is currently available of the patient's imaging, recent labs and medical records.   Labs:  CBC Latest Ref Rng & Units 12/07/2020 10/27/2020 12/05/2019  WBC 4.0 - 10.5 K/uL 6.0 5.9 5.3  Hemoglobin 12.0 - 15.0 g/dL 11.6(L) 12.5 12.1  Hematocrit 36.0 - 46.0 % 39.1 41.8 40.6  Platelets 150 - 400 K/uL 302 342 262   CMP Latest Ref Rng & Units 12/07/2020 10/27/2020 12/05/2019  Glucose 70 - 99 mg/dL 88 85 540(G)  BUN 6 - 20 mg/dL 13 12 18   Creatinine 0.44 - 1.00 mg/dL 8.67 6.19)  Sodium 135 - 145 mmol/L 137 138 140  Potassium 3.5 - 5.1 mmol/L 4.0 4.1 3.7  Chloride 98 - 111 mmol/L 103 100 107  CO2 22 - 32 mmol/L 27 26 27   Calcium 8.9 - 10.3 mg/dL 8.9 9.0 5.09(T)  Total Protein 6.5 - 8.1 g/dL 7.7 ) 7.0  Total Bilirubin 0.3 - 1.2 mg/dL 0.7 0.6 0.5  Alkaline Phos 38 - 126 U/L 106 104 99  AST 15 - 41 U/L 17 15 20   ALT 0 - 44 U/L 18 17 22       Imaging: Radiology review:  CLINICAL DATA:  Right upper quadrant pain x3 days  EXAM: ULTRASOUND ABDOMEN LIMITED RIGHT UPPER QUADRANT  COMPARISON:  CT from same day.  FINDINGS: Gallbladder:  The sonographic 2.6(Z sign is positive. There is a large gallstone within the gallbladder lumen. There is no gallbladder wall thickening. There is sludge. There is evidence for adenomyomatosis.  Common bile duct:  Diameter: 5 mm  Liver:  No focal lesion identified. Within normal limits in parenchymal echogenicity. Portal vein is patent on color Doppler imaging with normal  direction of blood flow towards the liver.  Other: None.  IMPRESSION: Findings are equivocal for acute cholecystitis. While there is a gallstone in the presence of a positive sonographic Murphy sign, there is no gallbladder wall thickening or pericholecystic free fluid. If there is high clinical suspicion for acute cholecystitis, follow-up with HIDA scan is recommended.   Electronically Signed   By: 1.2(W M.D.   On: 12/07/2020 22:33  CLINICAL DATA:  Lower abdominal pain for 3 days, diarrhea  EXAM: CT ABDOMEN AND PELVIS WITH CONTRAST  TECHNIQUE: Multidetector CT imaging of the abdomen and pelvis was performed using the standard protocol following bolus administration of intravenous contrast.  CONTRAST:  OMNIPAQUE IOHEXOL 300 MG/ML  SOLN  COMPARISON:  None.  FINDINGS: Lower  chest: No acute pleural or parenchymal lung disease.  Hepatobiliary: Calcified gallstone is seen within the gallbladder lumen. Gallbladder is minimally distended, with no gross evidence of gallbladder wall thickening or pericholecystic fluid. The liver is unremarkable. No biliary dilation.  Pancreas: Unremarkable. No pancreatic ductal dilatation or surrounding inflammatory changes.  Spleen: Normal in size without focal abnormality.  Adrenals/Urinary Tract: Adrenal glands are unremarkable. Kidneys are normal, without renal calculi, focal lesion, or hydronephrosis. Bladder is unremarkable.  Stomach/Bowel: No bowel obstruction or ileus. Normal appendix within the central upper pelvis. No bowel wall thickening or inflammatory change.  Vascular/Lymphatic: No significant vascular findings are present. No enlarged abdominal or pelvic lymph nodes.  Reproductive: Uterus and bilateral adnexa are unremarkable.  Other: No free fluid or free gas.  No abdominal wall hernia.  Musculoskeletal: No acute or destructive bony lesions. Reconstructed images demonstrate no  additional findings.  IMPRESSION: 1. Cholelithiasis, with no CT evidence of acute cholecystitis. If biliary pathology is suspected, correlation with right upper quadrant ultrasound could be performed. 2. Normal appendix. 3. Otherwise unremarkable CT of the abdomen and pelvis.   Electronically Signed   By: Sharlet Salina M.D.   On: 12/07/2020 21:16  Within last 24 hrs: No results found.  Assessment    Chronic calculus cholecystitis, Morbid obesity, Concerns regarding social situation, support system.  There are no problems to display for this patient.   Plan    We discussed at length the definitive treatment of what she presents with.  She seems rather fearful of surgery, while at the same time fearful of another gallbladder attack.  We reviewed the dietary measures to help prevent another attack but also provided some clarity regarding the challenges of putting off symptoms related to gallbladder.  We also reviewed the potential for additional complications of deferred treatment.  I did my best to attempt not to create any unreasonable fears and added to her anxiety.  I have asked her to come back in 2 weeks after discussing with family, friends and applying the dietary restrictions of which we spoke to see how she does.  Face-to-face time spent with the patient and accompanying care providers(if present) was 30 minutes, with more than 50% of the time spent counseling, educating, and coordinating care of the patient.      Campbell Lerner M.D., FACS 12/10/2020, 4:49 PM

## 2020-12-10 NOTE — Patient Instructions (Addendum)
If you have any concerns or questions, please feel free to call our office. See follow up appointment below.      Gallbladder Eating Plan If you have a gallbladder condition, you may have trouble digesting fats. Eating a low-fat diet can help reduce your symptoms, and may be helpful before and after having surgery to remove your gallbladder (cholecystectomy). Your health care provider may recommend that you work with a diet and nutrition specialist (dietitian) to help you reduce the amount of fat in your diet. What are tips for following this plan? General guidelines  Limit your fat intake to less than 30% of your total daily calories. If you eat around 1,800 calories each day, this is less than 60 grams (g) of fat per day.  Fat is an important part of a healthy diet. Eating a low-fat diet can make it hard to maintain a healthy body weight. Ask your dietitian how much fat, calories, and other nutrients you need each day.  Eat small, frequent meals throughout the day instead of three large meals.  Drink at least 8-10 cups of fluid a day. Drink enough fluid to keep your urine clear or pale yellow.  Limit alcohol intake to no more than 1 drink a day for nonpregnant women and 2 drinks a day for men. One drink equals 12 oz of beer, 5 oz of wine, or 1 oz of hard liquor. Reading food labels  Check Nutrition Facts on food labels for the amount of fat per serving. Choose foods with less than 3 grams of fat per serving.   Shopping  Choose nonfat and low-fat healthy foods. Look for the words "nonfat," "low fat," or "fat free."  Avoid buying processed or prepackaged foods. Cooking  Cook using low-fat methods, such as baking, broiling, grilling, or boiling.  Cook with small amounts of healthy fats, such as olive oil, grapeseed oil, canola oil, or sunflower oil. What foods are recommended?  All fresh, frozen, or canned fruits and vegetables.  Whole grains.  Low-fat or non-fat (skim) milk and  yogurt.  Lean meat, skinless poultry, fish, eggs, and beans.  Low-fat protein supplement powders or drinks.  Spices and herbs. What foods are not recommended?  High-fat foods. These include baked goods, fast food, fatty cuts of meat, ice cream, french toast, sweet rolls, pizza, cheese bread, foods covered with butter, creamy sauces, or cheese.  Fried foods. These include french fries, tempura, battered fish, breaded chicken, fried breads, and sweets.  Foods with strong odors.  Foods that cause bloating and gas. Summary  A low-fat diet can be helpful if you have a gallbladder condition, or before and after gallbladder surgery.  Limit your fat intake to less than 30% of your total daily calories. This is about 60 g of fat if you eat 1,800 calories each day.  Eat small, frequent meals throughout the day instead of three large meals. This information is not intended to replace advice given to you by your health care provider. Make sure you discuss any questions you have with your health care provider. Document Revised: 05/07/2020 Document Reviewed: 05/07/2020 Elsevier Patient Education  2021 [employer] Inc.     Minimally Invasive Cholecystectomy, Care After This sheet gives you information about how to care for yourself after your procedure. Your doctor may also give you more specific instructions. If you have problems or questions, contact your doctor. What can I expect after the procedure? After the procedure, it is common:  To have pain at the  areas of surgery. You will be given medicines for pain.  To vomit or feel like you may vomit.  To feel fullness in the belly (bloating) or to have pain in the shoulder. This comes from the gas that was used during the surgery. Follow these instructions at home: Medicines  Take over-the-counter and prescription medicines only as told by your doctor.  If you were prescribed an antibiotic medicine, take it as told by your doctor. Do not  stop using the antibiotic even if you start to feel better.  Ask your doctor if the medicine prescribed to you: ? Requires you to avoid driving or using machinery. ? Can cause trouble pooping (constipation). You may need to take these actions to prevent or treat trouble pooping:  Drink enough fluid to keep your pee (urine) pale yellow.  Take over-the-counter or prescription medicines.  Eat foods that are high in fiber. These include beans, whole grains, and fresh fruits and vegetables.  Limit foods that are high in fat and sugar. These include fried or sweet foods. Incision care  Follow instructions from your doctor about how to take care of your cuts from surgery (incisions). Make sure you: ? Wash your hands with soap and water for at least 20 seconds before and after you change your bandage (dressing). If you cannot use soap and water, use hand sanitizer. ? Change your bandage as told by your doctor. ? Leave stitches (sutures), skin glue, or skin tape (adhesive) strips in place. They may need to stay in place for 2 weeks or longer. If tape strips get loose and curl up, you may trim the loose edges. Do not remove tape strips completely unless your doctor says it is okay.  Do not take baths, swim, or use a hot tub until your doctor approves. Ask your doctor if you may take showers. You may only be allowed to take sponge baths.  Check your surgery area every day for signs of infection. Check for: ? More redness, swelling, or pain. ? Fluid or blood. ? Warmth. ? Pus or a bad smell.   Activity  Rest as told by your doctor.  Do not sit for a long time without moving. Get up to take short walks every 1-2 hours. This is important. Ask for help if you feel weak or unsteady.  Do not lift anything that is heavier than 10 lb (4.5 kg), or the limit that you are told, until your doctor says that it is safe.  Do not play contact sports until your doctor says it is okay.  Do not return to work  or school until your doctor says it is okay.  Return to your normal activities as told by your doctor. Ask your doctor what activities are safe for you. General instructions  If you were given a medicine to help you relax (sedative) during your procedure, it can affect you for many hours. Do not drive or use machinery until your doctor says that it is safe.  Keep all follow-up visits as told by your doctor. This is important. Contact a doctor if:  You get a rash.  You have more redness, swelling, or pain around your cuts from surgery.  You have fluid or blood coming from your cuts from surgery.  Your cuts from surgery feel warm to the touch.  You have pus or a bad smell coming from your cuts from surgery.  You have a fever.  One or more of your cuts from surgery breaks  open. Get help right away if:  You have trouble breathing.  You have chest pain.  You have pain that is getting worse in your shoulders.  You faint or feel dizzy when you stand.  You have very bad pain in your belly (abdomen).  You feel like you may vomit or you vomit, and this lasts for more than one day.  You have leg pain. Summary  After your surgery, it is common to have pain at the areas of surgery. You may also have vomiting or fullness in the belly.  Follow your doctor's instructions about medicine, activity restrictions, and caring for your surgery areas. Do not do activities that require a lot of effort.  Contact a doctor if you have a fever or other signs of infection, such as more redness, swelling, or pain around the cuts from surgery.  Get help right away if you have chest pain, increasing pain in the shoulders, or trouble breathing. This information is not intended to replace advice given to you by your health care provider. Make sure you discuss any questions you have with your health care provider. Document Revised: 09/03/2019 Document Reviewed: 09/03/2019 Elsevier Patient Education   2021 [employer].

## 2020-12-24 ENCOUNTER — Encounter: Payer: Self-pay | Admitting: Surgery

## 2020-12-24 ENCOUNTER — Other Ambulatory Visit: Payer: Self-pay

## 2020-12-24 ENCOUNTER — Ambulatory Visit (INDEPENDENT_AMBULATORY_CARE_PROVIDER_SITE_OTHER): Payer: Medicare Other | Admitting: Surgery

## 2020-12-24 VITALS — BP 148/93 | HR 108 | Temp 98.7°F | Ht 62.0 in | Wt 333.0 lb

## 2020-12-24 DIAGNOSIS — K8010 Calculus of gallbladder with chronic cholecystitis without obstruction: Secondary | ICD-10-CM | POA: Diagnosis not present

## 2020-12-24 NOTE — Patient Instructions (Signed)
Our surgery scheduler Britta Mccreedy will call you within 24-48 hours to get you scheduled. If you have not heard from her after 48 hours, please call our office. You will need to get Covid tested before surgery and have the blue sheet available when she calls to write down important information. If you have any concerns or questions, please feel free to call our office.      Minimally Invasive Cholecystectomy, Care After This sheet gives you information about how to care for yourself after your procedure. Your doctor may also give you more specific instructions. If you have problems or questions, contact your doctor. What can I expect after the procedure? After the procedure, it is common:  To have pain at the areas of surgery. You will be given medicines for pain.  To vomit or feel like you may vomit.  To feel fullness in the belly (bloating) or to have pain in the shoulder. This comes from the gas that was used during the surgery. Follow these instructions at home: Medicines  Take over-the-counter and prescription medicines only as told by your doctor.  If you were prescribed an antibiotic medicine, take it as told by your doctor. Do not stop using the antibiotic even if you start to feel better.  Ask your doctor if the medicine prescribed to you: ? Requires you to avoid driving or using machinery. ? Can cause trouble pooping (constipation). You may need to take these actions to prevent or treat trouble pooping:  Drink enough fluid to keep your pee (urine) pale yellow.  Take over-the-counter or prescription medicines.  Eat foods that are high in fiber. These include beans, whole grains, and fresh fruits and vegetables.  Limit foods that are high in fat and sugar. These include fried or sweet foods. Incision care  Follow instructions from your doctor about how to take care of your cuts from surgery (incisions). Make sure you: ? Wash your hands with soap and water for at least 20  seconds before and after you change your bandage (dressing). If you cannot use soap and water, use hand sanitizer. ? Change your bandage as told by your doctor. ? Leave stitches (sutures), skin glue, or skin tape (adhesive) strips in place. They may need to stay in place for 2 weeks or longer. If tape strips get loose and curl up, you may trim the loose edges. Do not remove tape strips completely unless your doctor says it is okay.  Do not take baths, swim, or use a hot tub until your doctor approves. Ask your doctor if you may take showers. You may only be allowed to take sponge baths.  Check your surgery area every day for signs of infection. Check for: ? More redness, swelling, or pain. ? Fluid or blood. ? Warmth. ? Pus or a bad smell.   Activity  Rest as told by your doctor.  Do not sit for a long time without moving. Get up to take short walks every 1-2 hours. This is important. Ask for help if you feel weak or unsteady.  Do not lift anything that is heavier than 10 lb (4.5 kg), or the limit that you are told, until your doctor says that it is safe.  Do not play contact sports until your doctor says it is okay.  Do not return to work or school until your doctor says it is okay.  Return to your normal activities as told by your doctor. Ask your doctor what activities are safe for  you. General instructions  If you were given a medicine to help you relax (sedative) during your procedure, it can affect you for many hours. Do not drive or use machinery until your doctor says that it is safe.  Keep all follow-up visits as told by your doctor. This is important. Contact a doctor if:  You get a rash.  You have more redness, swelling, or pain around your cuts from surgery.  You have fluid or blood coming from your cuts from surgery.  Your cuts from surgery feel warm to the touch.  You have pus or a bad smell coming from your cuts from surgery.  You have a fever.  One or more of  your cuts from surgery breaks open. Get help right away if:  You have trouble breathing.  You have chest pain.  You have pain that is getting worse in your shoulders.  You faint or feel dizzy when you stand.  You have very bad pain in your belly (abdomen).  You feel like you may vomit or you vomit, and this lasts for more than one day.  You have leg pain. Summary  After your surgery, it is common to have pain at the areas of surgery. You may also have vomiting or fullness in the belly.  Follow your doctor's instructions about medicine, activity restrictions, and caring for your surgery areas. Do not do activities that require a lot of effort.  Contact a doctor if you have a fever or other signs of infection, such as more redness, swelling, or pain around the cuts from surgery.  Get help right away if you have chest pain, increasing pain in the shoulders, or trouble breathing. This information is not intended to replace advice given to you by your health care provider. Make sure you discuss any questions you have with your health care provider. Document Revised: 09/03/2019 Document Reviewed: 09/03/2019 Elsevier Patient Education  2021 [employer].

## 2020-12-25 ENCOUNTER — Telehealth: Payer: Self-pay | Admitting: Surgery

## 2020-12-25 NOTE — Telephone Encounter (Signed)
Patient has been advised of Pre-Admission date/time, COVID Testing date and Surgery date.  Surgery Date: 01/11/21 Preadmission Testing Date: 12/30/20 (phone 1p-5p) Covid Testing Date: 01/07/21 in person at 9:55 am  - patient advised to go to [facility] ([address] [facility])   Patient has been made aware to call [phone], between 1-3:00pm the day before surgery, to find out what time to arrive for surgery.

## 2020-12-26 ENCOUNTER — Ambulatory Visit: Payer: Self-pay | Admitting: Surgery

## 2020-12-26 DIAGNOSIS — K8010 Calculus of gallbladder with chronic cholecystitis without obstruction: Secondary | ICD-10-CM

## 2020-12-26 NOTE — Progress Notes (Signed)
Patient ID: Erica Webster, female   DOB: 10/18/1985, 35 y.o.   MRN: [id number]  Chief Complaint: Gallstones  History of Present Illness Jeffifer Blann is a 35 y.o. female with recent diagnosis of gallstones.  She reports her abdominal pain is a 3 to a 5, it begins in the right upper quadrant but seems to extend across the central abdomen and into the left flank.  She reports the pain is worse that night after her meal.  She is aware of the foods she needs to avoid.  She denies any history of fevers.  She reports nausea, and has vomited before.  She states her bowel movements are normal.  She apparently lives alone, moving from [place] after her parents died.  She reports she has an uncle and a cousin living in the area.  But even since her ED visit, she has not heard back from her cousin.  Past Medical History Past Medical History:  Diagnosis Date  . Asthma   . Depression       Past Surgical History:  Procedure Laterality Date  . TOENAIL EXCISION      Allergies  Allergen Reactions  . Shellfish Allergy Anaphylaxis  . Penicillins Itching    Current Outpatient Medications  Medication Sig Dispense Refill  . albuterol (PROVENTIL HFA;VENTOLIN HFA) 108 (90 Base) MCG/ACT inhaler Inhale 2 puffs into the lungs every 4 (four) hours as needed for wheezing or shortness of breath.    . ARIPiprazole (ABILIFY) 10 MG tablet Take 1.5 tablets (15 mg total) by mouth daily.    . Cholecalciferol (VITAMIN D3) 50 MCG (2000 UT) TABS Take 2,000 Units by mouth daily.    . dicyclomine (BENTYL) 10 MG capsule Take 1 capsule (10 mg total) by mouth 4 (four) times daily for 14 days. (Patient not taking: Reported on 12/25/2020) 56 capsule 0  . Spacer/Aero Chamber Mouthpiece MISC 1 Units by Does not apply route every 4 (four) hours as needed (wheezing). 1 each 0  . vitamin B-12 (CYANOCOBALAMIN) 500 MCG tablet Take 500 mcg by mouth daily.    . FLOVENT HFA 110 MCG/ACT inhaler Inhale 2 puffs into the lungs 2 (two)  times daily.     No current facility-administered medications for this visit.    Family History Family History  Adopted: Yes  Problem Relation Age of Onset  . Breast cancer Neg Hx       Social History Social History   Tobacco Use  . Smoking status: Never Smoker  . Smokeless tobacco: Never Used  Substance Use Topics  . Alcohol use: No  . Drug use: No       Review of Systems  Constitutional: Positive for chills, fever, malaise/fatigue and weight loss.  HENT: Negative.   Eyes: Negative.   Respiratory: Positive for shortness of breath and wheezing.   Cardiovascular: Positive for leg swelling.  Gastrointestinal: Positive for abdominal pain, constipation, diarrhea, nausea and vomiting.  Genitourinary: Positive for frequency and urgency.  Skin: Positive for itching.  Neurological: Positive for dizziness and speech change.  Psychiatric/Behavioral: Positive for depression.      Physical Exam Blood pressure (!) 148/93, pulse (!) 108, temperature 98.7 F (37.1 C), temperature source Oral, height 5' 2" (1.575 m), weight (!) 333 lb (151 kg), last menstrual period 11/30/2020, SpO2 95 %. Last Weight  Most recent update: 12/24/2020  1:58 PM   Weight  151 kg (333 lb)              CONSTITUTIONAL: Well developed,   and nourished, appropriately responsive and aware without distress.  Morbidly obese. EYES: Sclera non-icteric.   EARS, NOSE, MOUTH AND THROAT: Mask worn.  The oropharynx is clear. Oral mucosa is pink and moist.    Hearing is intact to voice.  NECK: Trachea is midline, and there is no jugular venous distension.  LYMPH NODES:  Lymph nodes in the neck are not appreciable. RESPIRATORY:  Lungs are clear, and breath sounds are equal bilaterally. Normal respiratory effort without pathologic use of accessory muscles. CARDIOVASCULAR: Heart is regular in rate and rhythm. GI: The abdomen is morbidly obese, soft, nontender, and nondistended. There were no palpable masses. I did not  appreciate hepatosplenomegaly. There were normal bowel sounds.  MUSCULOSKELETAL:  Symmetrical muscle tone appreciated in all four extremities.    SKIN: Skin turgor is normal. No pathologic skin lesions appreciated.  NEUROLOGIC:  Motor and sensation appear grossly normal.  Cranial nerves are grossly without defect. PSYCH:  Alert and oriented to person, place and time. Affect is appropriate for situation.  I made a deliberate effort to simplify all the conversation to ensure adequate understanding of her choices and options.  Data Reviewed I have personally reviewed what is currently available of the patient's imaging, recent labs and medical records.   Labs:  CBC Latest Ref Rng & Units 12/07/2020 10/27/2020 12/05/2019  WBC 4.0 - 10.5 K/uL 6.0 5.9 5.3  Hemoglobin 12.0 - 15.0 g/dL 11.6(L) 12.5 12.1  Hematocrit 36.0 - 46.0 % 39.1 41.8 40.6  Platelets 150 - 400 K/uL 302 342 262   CMP Latest Ref Rng & Units 12/07/2020 10/27/2020 12/05/2019  Glucose 70 - 99 mg/dL 88 85 256(L)  BUN 6 - 20 mg/dL 13 12 18   Creatinine 0.44 - 1.00 mg/dL 8.93 7.34)  Sodium 135 - 145 mmol/L 137 138 140  Potassium 3.5 - 5.1 mmol/L 4.0 4.1 3.7  Chloride 98 - 111 mmol/L 103 100 107  CO2 22 - 32 mmol/L 27 26 27   Calcium 8.9 - 10.3 mg/dL 8.9 9.0 2.87(G)  Total Protein 6.5 - 8.1 g/dL 7.7 ) 7.0  Total Bilirubin 0.3 - 1.2 mg/dL 0.7 0.6 0.5  Alkaline Phos 38 - 126 U/L 106 104 99  AST 15 - 41 U/L 17 15 20   ALT 0 - 44 U/L 18 17 22       Imaging: Radiology review:  CLINICAL DATA:  Right upper quadrant pain x3 days  EXAM: ULTRASOUND ABDOMEN LIMITED RIGHT UPPER QUADRANT  COMPARISON:  CT from same day.  FINDINGS: Gallbladder:  The sonographic 8.1(L sign is positive. There is a large gallstone within the gallbladder lumen. There is no gallbladder wall thickening. There is sludge. There is evidence for adenomyomatosis.  Common bile duct:  Diameter: 5 mm  Liver:  No focal lesion identified. Within  normal limits in parenchymal echogenicity. Portal vein is patent on color Doppler imaging with normal direction of blood flow towards the liver.  Other: None.  IMPRESSION: Findings are equivocal for acute cholecystitis. While there is a gallstone in the presence of a positive sonographic Murphy sign, there is no gallbladder wall thickening or pericholecystic free fluid. If there is high clinical suspicion for acute cholecystitis, follow-up with HIDA scan is recommended.   Electronically Signed   By: 5.7(W M.D.   On: 12/07/2020 22:33  CLINICAL DATA:  Lower abdominal pain for 3 days, diarrhea  EXAM: CT ABDOMEN AND PELVIS WITH CONTRAST  TECHNIQUE: Multidetector CT imaging of the abdomen and pelvis was performed using the standard  protocol following bolus administration of intravenous contrast.  CONTRAST:  OMNIPAQUE IOHEXOL 300 MG/ML  SOLN  COMPARISON:  None.  FINDINGS: Lower chest: No acute pleural or parenchymal lung disease.  Hepatobiliary: Calcified gallstone is seen within the gallbladder lumen. Gallbladder is minimally distended, with no gross evidence of gallbladder wall thickening or pericholecystic fluid. The liver is unremarkable. No biliary dilation.  Pancreas: Unremarkable. No pancreatic ductal dilatation or surrounding inflammatory changes.  Spleen: Normal in size without focal abnormality.  Adrenals/Urinary Tract: Adrenal glands are unremarkable. Kidneys are normal, without renal calculi, focal lesion, or hydronephrosis. Bladder is unremarkable.  Stomach/Bowel: No bowel obstruction or ileus. Normal appendix within the central upper pelvis. No bowel wall thickening or inflammatory change.  Vascular/Lymphatic: No significant vascular findings are present. No enlarged abdominal or pelvic lymph nodes.  Reproductive: Uterus and bilateral adnexa are unremarkable.  Other: No free fluid or free gas.  No abdominal wall  hernia.  Musculoskeletal: No acute or destructive bony lesions. Reconstructed images demonstrate no additional findings.  IMPRESSION: 1. Cholelithiasis, with no CT evidence of acute cholecystitis. If biliary pathology is suspected, correlation with right upper quadrant ultrasound could be performed. 2. Normal appendix. 3. Otherwise unremarkable CT of the abdomen and pelvis.   Electronically Signed   By: Sharlet Salina M.D.   On: 12/07/2020 21:16  Within last 24 hrs: No results found.  Assessment    Chronic calculus cholecystitis, Morbid obesity, Concerns regarding social situation, support system.  Patient Active Problem List   Diagnosis Date Noted  . CCC (chronic calculous cholecystitis) 12/10/2020  . Severe obesity (BMI >= 40) (HCC) 12/10/2020    Plan    After discussing with family, friends and applying the dietary restrictions she has determined she would like to proceed with surgery.   Robotic cholecystectomy. This was discussed thoroughly.  Optimal plan is for robotic cholecystectomy.  Risks and benefits have been discussed with the patient which include but are not limited to anesthesia, bleeding, infection, biliary ductal injury or stenosis, other associated unanticipated injuries affiliated with laparoscopic surgery.   She understands she carries additional risk due to her morbid obesity.  I believe there is the desire to proceed.  Questions elicited and answered to satisfaction.  No guarantees ever expressed or implied.  Face-to-face time spent with the patient and accompanying care providers(if present) was 20 minutes, with more than 50% of the time spent counseling, educating, and coordinating care of the patient.      Campbell Lerner M.D., FACS 12/26/2020, 12:54 PM

## 2020-12-26 NOTE — H&P (View-Only) (Signed)
Patient ID: Erica Webster, female   DOB: 1986-02-25, 35 y.o.   MRN: [id number]  Chief Complaint: Gallstones  History of Present Illness Erica Webster is a 35 y.o. female with recent diagnosis of gallstones.  She reports her abdominal pain is a 3 to a 5, it begins in the right upper quadrant but seems to extend across the central abdomen and into the left flank.  She reports the pain is worse that night after her meal.  She is aware of the foods she needs to avoid.  She denies any history of fevers.  She reports nausea, and has vomited before.  She states her bowel movements are normal.  She apparently lives alone, moving from [place] after her parents died.  She reports she has an uncle and a cousin living in the area.  But even since her ED visit, she has not heard back from her cousin.  Past Medical History Past Medical History:  Diagnosis Date  . Asthma   . Depression       Past Surgical History:  Procedure Laterality Date  . TOENAIL EXCISION      Allergies  Allergen Reactions  . Shellfish Allergy Anaphylaxis  . Penicillins Itching    Current Outpatient Medications  Medication Sig Dispense Refill  . albuterol (PROVENTIL HFA;VENTOLIN HFA) 108 (90 Base) MCG/ACT inhaler Inhale 2 puffs into the lungs every 4 (four) hours as needed for wheezing or shortness of breath.    . ARIPiprazole (ABILIFY) 10 MG tablet Take 1.5 tablets (15 mg total) by mouth daily.    . Cholecalciferol (VITAMIN D3) 50 MCG (2000 UT) TABS Take 2,000 Units by mouth daily.    Marland Kitchen dicyclomine (BENTYL) 10 MG capsule Take 1 capsule (10 mg total) by mouth 4 (four) times daily for 14 days. (Patient not taking: Reported on 12/25/2020) 56 capsule 0  . Spacer/Aero Chamber Mouthpiece MISC 1 Units by Does not apply route every 4 (four) hours as needed (wheezing). 1 each 0  . vitamin B-12 (CYANOCOBALAMIN) 500 MCG tablet Take 500 mcg by mouth daily.    Marland Kitchen FLOVENT HFA 110 MCG/ACT inhaler Inhale 2 puffs into the lungs 2 (two)  times daily.     No current facility-administered medications for this visit.    Family History Family History  Adopted: Yes  Problem Relation Age of Onset  . Breast cancer Neg Hx       Social History Social History   Tobacco Use  . Smoking status: Never Smoker  . Smokeless tobacco: Never Used  Substance Use Topics  . Alcohol use: No  . Drug use: No       Review of Systems  Constitutional: Positive for chills, fever, malaise/fatigue and weight loss.  HENT: Negative.   Eyes: Negative.   Respiratory: Positive for shortness of breath and wheezing.   Cardiovascular: Positive for leg swelling.  Gastrointestinal: Positive for abdominal pain, constipation, diarrhea, nausea and vomiting.  Genitourinary: Positive for frequency and urgency.  Skin: Positive for itching.  Neurological: Positive for dizziness and speech change.  Psychiatric/Behavioral: Positive for depression.      Physical Exam Blood pressure (!) 148/93, pulse (!) 108, temperature 98.7 F (37.1 C), temperature source Oral, height 5\' 2"  (1.575 m), weight (!) 333 lb (151 kg), last menstrual period 11/30/2020, SpO2 95 %. Last Weight  Most recent update: 12/24/2020  1:58 PM   Weight  151 kg (333 lb)              CONSTITUTIONAL: Well developed,  and nourished, appropriately responsive and aware without distress.  Morbidly obese. EYES: Sclera non-icteric.   EARS, NOSE, MOUTH AND THROAT: Mask worn.  The oropharynx is clear. Oral mucosa is pink and moist.    Hearing is intact to voice.  NECK: Trachea is midline, and there is no jugular venous distension.  LYMPH NODES:  Lymph nodes in the neck are not appreciable. RESPIRATORY:  Lungs are clear, and breath sounds are equal bilaterally. Normal respiratory effort without pathologic use of accessory muscles. CARDIOVASCULAR: Heart is regular in rate and rhythm. GI: The abdomen is morbidly obese, soft, nontender, and nondistended. There were no palpable masses. I did not  appreciate hepatosplenomegaly. There were normal bowel sounds.  MUSCULOSKELETAL:  Symmetrical muscle tone appreciated in all four extremities.    SKIN: Skin turgor is normal. No pathologic skin lesions appreciated.  NEUROLOGIC:  Motor and sensation appear grossly normal.  Cranial nerves are grossly without defect. PSYCH:  Alert and oriented to person, place and time. Affect is appropriate for situation.  I made a deliberate effort to simplify all the conversation to ensure adequate understanding of her choices and options.  Data Reviewed I have personally reviewed what is currently available of the patient's imaging, recent labs and medical records.   Labs:  CBC Latest Ref Rng & Units 12/07/2020 10/27/2020 12/05/2019  WBC 4.0 - 10.5 K/uL 6.0 5.9 5.3  Hemoglobin 12.0 - 15.0 g/dL 11.6(L) 12.5 12.1  Hematocrit 36.0 - 46.0 % 39.1 41.8 40.6  Platelets 150 - 400 K/uL 302 342 262   CMP Latest Ref Rng & Units 12/07/2020 10/27/2020 12/05/2019  Glucose 70 - 99 mg/dL 88 85 256(L)  BUN 6 - 20 mg/dL 13 12 18   Creatinine 0.44 - 1.00 mg/dL 8.93 7.34)  Sodium 135 - 145 mmol/L 137 138 140  Potassium 3.5 - 5.1 mmol/L 4.0 4.1 3.7  Chloride 98 - 111 mmol/L 103 100 107  CO2 22 - 32 mmol/L 27 26 27   Calcium 8.9 - 10.3 mg/dL 8.9 9.0 2.87(G)  Total Protein 6.5 - 8.1 g/dL 7.7 ) 7.0  Total Bilirubin 0.3 - 1.2 mg/dL 0.7 0.6 0.5  Alkaline Phos 38 - 126 U/L 106 104 99  AST 15 - 41 U/L 17 15 20   ALT 0 - 44 U/L 18 17 22       Imaging: Radiology review:  CLINICAL DATA:  Right upper quadrant pain x3 days  EXAM: ULTRASOUND ABDOMEN LIMITED RIGHT UPPER QUADRANT  COMPARISON:  CT from same day.  FINDINGS: Gallbladder:  The sonographic 8.1(L sign is positive. There is a large gallstone within the gallbladder lumen. There is no gallbladder wall thickening. There is sludge. There is evidence for adenomyomatosis.  Common bile duct:  Diameter: 5 mm  Liver:  No focal lesion identified. Within  normal limits in parenchymal echogenicity. Portal vein is patent on color Doppler imaging with normal direction of blood flow towards the liver.  Other: None.  IMPRESSION: Findings are equivocal for acute cholecystitis. While there is a gallstone in the presence of a positive sonographic Murphy sign, there is no gallbladder wall thickening or pericholecystic free fluid. If there is high clinical suspicion for acute cholecystitis, follow-up with HIDA scan is recommended.   Electronically Signed   By: 5.7(W M.D.   On: 12/07/2020 22:33  CLINICAL DATA:  Lower abdominal pain for 3 days, diarrhea  EXAM: CT ABDOMEN AND PELVIS WITH CONTRAST  TECHNIQUE: Multidetector CT imaging of the abdomen and pelvis was performed using the standard  protocol following bolus administration of intravenous contrast.  CONTRAST:  OMNIPAQUE IOHEXOL 300 MG/ML  SOLN  COMPARISON:  None.  FINDINGS: Lower chest: No acute pleural or parenchymal lung disease.  Hepatobiliary: Calcified gallstone is seen within the gallbladder lumen. Gallbladder is minimally distended, with no gross evidence of gallbladder wall thickening or pericholecystic fluid. The liver is unremarkable. No biliary dilation.  Pancreas: Unremarkable. No pancreatic ductal dilatation or surrounding inflammatory changes.  Spleen: Normal in size without focal abnormality.  Adrenals/Urinary Tract: Adrenal glands are unremarkable. Kidneys are normal, without renal calculi, focal lesion, or hydronephrosis. Bladder is unremarkable.  Stomach/Bowel: No bowel obstruction or ileus. Normal appendix within the central upper pelvis. No bowel wall thickening or inflammatory change.  Vascular/Lymphatic: No significant vascular findings are present. No enlarged abdominal or pelvic lymph nodes.  Reproductive: Uterus and bilateral adnexa are unremarkable.  Other: No free fluid or free gas.  No abdominal wall  hernia.  Musculoskeletal: No acute or destructive bony lesions. Reconstructed images demonstrate no additional findings.  IMPRESSION: 1. Cholelithiasis, with no CT evidence of acute cholecystitis. If biliary pathology is suspected, correlation with right upper quadrant ultrasound could be performed. 2. Normal appendix. 3. Otherwise unremarkable CT of the abdomen and pelvis.   Electronically Signed   By: Sharlet Salina M.D.   On: 12/07/2020 21:16  Within last 24 hrs: No results found.  Assessment    Chronic calculus cholecystitis, Morbid obesity, Concerns regarding social situation, support system.  Patient Active Problem List   Diagnosis Date Noted  . CCC (chronic calculous cholecystitis) 12/10/2020  . Severe obesity (BMI >= 40) (HCC) 12/10/2020    Plan    After discussing with family, friends and applying the dietary restrictions she has determined she would like to proceed with surgery.   Robotic cholecystectomy. This was discussed thoroughly.  Optimal plan is for robotic cholecystectomy.  Risks and benefits have been discussed with the patient which include but are not limited to anesthesia, bleeding, infection, biliary ductal injury or stenosis, other associated unanticipated injuries affiliated with laparoscopic surgery.   She understands she carries additional risk due to her morbid obesity.  I believe there is the desire to proceed.  Questions elicited and answered to satisfaction.  No guarantees ever expressed or implied.  Face-to-face time spent with the patient and accompanying care providers(if present) was 20 minutes, with more than 50% of the time spent counseling, educating, and coordinating care of the patient.      Campbell Lerner M.D., FACS 12/26/2020, 12:54 PM

## 2020-12-30 ENCOUNTER — Encounter
Admission: RE | Admit: 2020-12-30 | Discharge: 2020-12-30 | Disposition: A | Discharge status: Home / Self Care | Payer: Medicare Other | Source: Ambulatory Visit | Attending: Surgery | Admitting: Surgery

## 2020-12-30 ENCOUNTER — Other Ambulatory Visit: Payer: Self-pay

## 2020-12-30 ENCOUNTER — Telehealth: Payer: Self-pay | Admitting: Surgery

## 2020-12-30 NOTE — Progress Notes (Signed)
Perioperative Services Pre-Admission/Anesthesia Testing   Date: 12/30/20 Name: Joslyn Ramos MRN:   [id number]  Re: Consideration of preoperative prophylactic antibiotic change   Request sent to: Campbell Lerner, MD (routed and/or faxed via [facility])  Planned Surgical Procedure(s):    Case: [id number] Date/Time: 01/11/21 1233   Procedure: XI ROBOTIC ASSISTED LAPAROSCOPIC CHOLECYSTECTOMY (N/A )   Anesthesia type: General   Pre-op diagnosis: robotic cholecystectomy   Location: [facility] OR ROOM 06 / [facility]   Surgeons: Campbell Lerner, MD    Notes: 1. Patient has a documented allergy to PCN  . Advising that PCN has caused her to experience low severity pruritis in the past.   2. Screened as appropriate for cephalosporin use during medication reconciliation . No immediate angioedema, dysphagia, SOB, anaphylaxis symptoms. . No severe rash involving mucous membranes or skin necrosis. . No hospital admissions related to side effects of PCN/cephalosporin use.  . No documented reaction to PCN or cephalosporin in the last 10 years.  Request:  As an evidence based approach to reducing the rate of incidence for post-operative SSI and the development of MDROs, could an agent with narrower coverage for preoperative prophylaxis in this patient's upcoming surgical course be considered?   1. Currently ordered preoperative prophylactic ABX: ciprofloxacin.   2. Specifically requesting change to cephalosporin (CEFAZOLIN).   3. Please communicate decision with me and I will change the orders in Epic as per your direction.   Things to consider:  Many patients report that they were "allergic" to PCN earlier in life, however this does not translate into a true lifelong allergy. Patients can lose sensitivity to specific IgE antibodies over time if PCN is avoided (Kleris & Lugar, 2019).   Up to 10% of the adult population and 15% of hospitalized patients report an allergy to PCN,  however clinical studies suggest that 90% of those reporting an allergy can tolerate PCN antibiotics (Kleris & Lugar, 2019).   Cross-sensitivity between PCN and cephalosporins has been documented as being as high as 10%, however this estimation included data believed to have been collected in a setting where there was contamination. Newer data suggests that the prevalence of cross-sensitivity between PCN and cephalosporins is actually estimated to be closer to 1% (Hermanides et al., 2018).    Patients labeled as PCN allergic, whether they are truly allergic or not, have been found to have inferior outcomes in terms of rates of serious infection, and these patients tend to have longer hospital stays [facility] & Lugar, 2019).   Treatment related secondary infections, such as Clostridioides difficile, have been linked to the improper use of broad spectrum antibiotics in patients improperly labeled as PCN allergic (Kleris & Lugar, 2019).   Anaphylaxis from cephalosporins is rare and the evidence suggests that there is no increased risk of an anaphylactic type reaction when cephalosporins are used in a PCN allergic patient (Pichichero, 2006).  Citations: Hermanides J, Lemkes BA, Prins Gwenyth Bender MW, Terreehorst I. Presumed ?-Lactam Allergy and Cross-reactivity in the Operating Theater: A Practical Approach. Anesthesiology. 2018 Aug;129(2):335-342. doi: 10.1097/ALN.0000000000002252. PMID: 28413244.  Kleris, R. S., & Lugar, P. L. (2019). Things We Do For No Reason: Failing to Question a Penicillin Allergy History. Journal of hospital medicine, 14(10), [phone]. Advance online publication. airportbarriers.com  Pichichero, M. E. (2006). Cephalosporins can be prescribed safely for penicillin-allergic patients. Journal of family medicine, 55(2), 106-112. Accessed: https://cdn.mdedge.com/files/s36fs-public/Document/September-2017/5502JFP_AppliedEvidence1.pdf   Quentin Mulling, MSN, APRN, FNP-C, CEN [facility]  Peri-operative Services Nurse Practitioner FAX: [phone]  [phone] 12/30/20 2:16 PM

## 2020-12-30 NOTE — Telephone Encounter (Signed)
Patient is calling and is asking if one of the nurses would give her a call back, patient said she was vomiting and also had some gagging, patient also has been feeling very shaky and is a little concerned. Please call patient and advise.

## 2020-12-30 NOTE — Patient Instructions (Addendum)
Your procedure is scheduled on: January 11, 2021 MONDAY Report to the Registration Desk on the 1st floor of the [facility] To find out your arrival time, please call [phone] between 1PM - 3PM on: Friday January 08, 2021  REMEMBER: Instructions that are not followed completely may result in serious medical risk, up to and including death; or upon the discretion of your surgeon and anesthesiologist your surgery may need to be rescheduled.  Do not eat OR DRINK after midnight the night before surgery.   TAKE THESE MEDICATIONS THE MORNING OF SURGERY WITH A SIP OF WATER: None  USE INHALER THE DAY OF SURGERY  One week prior to surgery: Stop Anti-inflammatories (NSAIDS) such as Advil, Aleve, Ibuprofen, Motrin, Naproxen, Naprosyn and ASPIRIN OR Aspirin based products such as Excedrin, Goodys Powder, BC Powder. Stop ANY OVER THE COUNTER supplements until after surgery.  No Alcohol for 24 hours before or after surgery.  No Smoking including e-cigarettes for 24 hours prior to surgery.  No chewable tobacco products for at least 6 hours prior to surgery.  No nicotine patches on the day of surgery.  Do not use any "recreational" drugs for at least a week prior to your surgery.  Please be advised that the combination of cocaine and anesthesia may have negative outcomes, up to and including death. If you test positive for cocaine, your surgery will be cancelled.  On the morning of surgery brush your teeth with toothpaste and water, you may rinse your mouth with mouthwash if you wish. Do not swallow any toothpaste or mouthwash.  Do not wear jewelry, make-up, hairpins, clips or nail polish.  Do not wear lotions, powders, or perfumes.   Do not shave body from the neck down 48 hours prior to surgery just in case you cut yourself which could leave a site for infection.  Also, freshly shaved skin may become irritated if using the CHG soap.  Contact lenses, hearing aids and dentures may not be  worn into surgery.  Do not bring valuables to the hospital. [facility] is not responsible for any missing/lost belongings or valuables.   Use CHG Soap as directed on instruction sheet.  Notify your doctor if there is any change in your medical condition (cold, fever, infection).  Wear comfortable clothing (specific to your surgery type) to the hospital.  Plan for stool softeners for home use; pain medications have a tendency to cause constipation. You can also help prevent constipation by eating foods high in fiber such as fruits and vegetables and drinking plenty of fluids as your diet allows.  After surgery, you can help prevent lung complications by doing breathing exercises.  Take deep breaths and cough every 1-2 hours. Your doctor may order a device called an Incentive Spirometer to help you take deep breaths. When coughing or sneezing, hold a pillow firmly against your incision with both hands. This is called "splinting." Doing this helps protect your incision. It also decreases belly discomfort.  If you are being admitted to the hospital overnight, leave your suitcase in the car. After surgery it may be brought to your room.  If you are being discharged the day of surgery, you will not be allowed to drive home. You will need a responsible adult (18 years or older) to drive you home and stay with you that night.   If you are taking public transportation, you will need to have a responsible adult (18 years or older) with you. Please confirm with your physician that  it is acceptable to use public transportation.   Please call the Pre-admissions Testing Dept. at [phone] if you have any questions about these instructions.  Surgery Visitation Policy:  Patients undergoing a surgery or procedure may have one family member or support person with them as long as that person is not COVID-19 positive or experiencing its symptoms.  That person may remain in the waiting area during  the procedure.  Inpatient Visitation:    Visiting hours are 7 a.m. to 8 p.m. Inpatients will be allowed two visitors daily. The visitors may change each day during the patient's stay. No visitors under the age of 16. Any visitor under the age of 57 must be accompanied by an adult. The visitor must pass COVID-19 screenings, use hand sanitizer when entering and exiting the patient's room and wear a mask at all times, including in the patient's room. Patients must also wear a mask when staff or their visitor are in the room. Masking is required regardless of vaccination status.

## 2020-12-30 NOTE — Telephone Encounter (Signed)
Patient scheduled for surgery Cholecystectomy-  01/11/21-she states she vomited today-she has taken zofran for the nausea and it helped- denies fever-denies pain- patient instructed to continue to take zofran when  Needed to help with the nausea and if she gets worse to go to the emergency room if the office is closed.

## 2021-01-07 ENCOUNTER — Other Ambulatory Visit
Admission: RE | Admit: 2021-01-07 | Discharge: 2021-01-07 | Disposition: A | Discharge status: Home / Self Care | Payer: Medicare Other | Source: Ambulatory Visit | Attending: Surgery | Admitting: Surgery

## 2021-01-07 ENCOUNTER — Other Ambulatory Visit: Payer: Self-pay

## 2021-01-07 DIAGNOSIS — Z20822 Contact with and (suspected) exposure to covid-19: Secondary | ICD-10-CM | POA: Diagnosis not present

## 2021-01-07 DIAGNOSIS — Z01812 Encounter for preprocedural laboratory examination: Secondary | ICD-10-CM | POA: Insufficient documentation

## 2021-01-08 LAB — SARS CORONAVIRUS 2 (TAT 6-24 HRS): SARS Coronavirus 2: NEGATIVE

## 2021-01-11 ENCOUNTER — Ambulatory Visit: Payer: Medicare Other | Admitting: Urgent Care

## 2021-01-11 ENCOUNTER — Ambulatory Visit
Admission: RE | Admit: 2021-01-11 | Discharge: 2021-01-11 | Disposition: A | Discharge status: Home / Self Care | Payer: Medicare Other | Attending: Surgery | Admitting: Surgery

## 2021-01-11 ENCOUNTER — Encounter
Admission: RE | Disposition: A | Discharge status: Home / Self Care | Payer: Self-pay | Source: Home / Self Care | Attending: Surgery

## 2021-01-11 ENCOUNTER — Encounter: Payer: Self-pay | Admitting: Surgery

## 2021-01-11 ENCOUNTER — Other Ambulatory Visit: Payer: Self-pay

## 2021-01-11 DIAGNOSIS — Z880 Allergy status to penicillin: Secondary | ICD-10-CM | POA: Insufficient documentation

## 2021-01-11 DIAGNOSIS — Z79899 Other long term (current) drug therapy: Secondary | ICD-10-CM | POA: Diagnosis not present

## 2021-01-11 DIAGNOSIS — K8010 Calculus of gallbladder with chronic cholecystitis without obstruction: Secondary | ICD-10-CM

## 2021-01-11 DIAGNOSIS — Z6841 Body Mass Index (BMI) 40.0 and over, adult: Secondary | ICD-10-CM

## 2021-01-11 LAB — POCT PREGNANCY, URINE: Preg Test, Ur: NEGATIVE

## 2021-01-11 SURGERY — CHOLECYSTECTOMY, ROBOT-ASSISTED, LAPAROSCOPIC
Anesthesia: General

## 2021-01-11 MED ORDER — CIPROFLOXACIN IN D5W 400 MG/200ML IV SOLN
400.0000 mg | INTRAVENOUS | Status: AC
  Administered 2021-01-11: 400 mg via INTRAVENOUS

## 2021-01-11 MED ORDER — FENTANYL CITRATE (PF) 250 MCG/5ML IJ SOLN
INTRAMUSCULAR | Status: AC
  Filled 2021-01-11: qty 5

## 2021-01-11 MED ORDER — FENTANYL CITRATE (PF) 100 MCG/2ML IJ SOLN
INTRAMUSCULAR | Status: AC
  Administered 2021-01-11: 25 ug via INTRAVENOUS
  Filled 2021-01-11: qty 2

## 2021-01-11 MED ORDER — LIDOCAINE HCL (CARDIAC) PF 100 MG/5ML IV SOSY
PREFILLED_SYRINGE | INTRAVENOUS | Status: DC | PRN
  Administered 2021-01-11: 100 mg via INTRAVENOUS

## 2021-01-11 MED ORDER — HYDROCODONE-ACETAMINOPHEN 5-325 MG PO TABS: 1.0000 | ORAL_TABLET | Freq: Four times a day (QID) | ORAL | 0 refills | Status: DC | PRN

## 2021-01-11 MED ORDER — MEPERIDINE HCL 50 MG/ML IJ SOLN: 6.2500 mg | INTRAMUSCULAR | Status: DC | PRN

## 2021-01-11 MED ORDER — FENTANYL CITRATE (PF) 100 MCG/2ML IJ SOLN
INTRAMUSCULAR | Status: DC | PRN
  Administered 2021-01-11 (×2): 25 ug via INTRAVENOUS
  Administered 2021-01-11: 50 ug via INTRAVENOUS

## 2021-01-11 MED ORDER — GABAPENTIN 300 MG PO CAPS: 300.0000 mg | ORAL_CAPSULE | ORAL | Status: AC

## 2021-01-11 MED ORDER — ACETAMINOPHEN 500 MG PO TABS: 1000.0000 mg | ORAL_TABLET | ORAL | Status: AC

## 2021-01-11 MED ORDER — OXYCODONE HCL 5 MG PO TABS
ORAL_TABLET | ORAL | Status: AC
  Filled 2021-01-11: qty 1

## 2021-01-11 MED ORDER — CHLORHEXIDINE GLUCONATE CLOTH 2 % EX PADS
6.0000 | MEDICATED_PAD | Freq: Once | CUTANEOUS | Status: AC
  Administered 2021-01-11: 6 via TOPICAL

## 2021-01-11 MED ORDER — CIPROFLOXACIN IN D5W 400 MG/200ML IV SOLN
INTRAVENOUS | Status: AC
  Filled 2021-01-11: qty 200

## 2021-01-11 MED ORDER — SUGAMMADEX SODIUM 500 MG/5ML IV SOLN
INTRAVENOUS | Status: DC | PRN
  Administered 2021-01-11: 300 mg via INTRAVENOUS

## 2021-01-11 MED ORDER — METRONIDAZOLE IVPB CUSTOM: 1000.0000 mg | Freq: Once | INTRAVENOUS | Status: DC

## 2021-01-11 MED ORDER — CELECOXIB 200 MG PO CAPS
ORAL_CAPSULE | ORAL | Status: AC
  Administered 2021-01-11: 200 mg via ORAL
  Filled 2021-01-11: qty 1

## 2021-01-11 MED ORDER — CHLORHEXIDINE GLUCONATE 0.12 % MT SOLN
OROMUCOSAL | Status: AC
  Administered 2021-01-11: 15 mL via OROMUCOSAL
  Filled 2021-01-11: qty 15

## 2021-01-11 MED ORDER — METRONIDAZOLE IN NACL 5-0.79 MG/ML-% IV SOLN
500.0000 mg | INTRAVENOUS | Status: AC
  Administered 2021-01-11: 1000 mg via INTRAVENOUS
  Filled 2021-01-11 (×2): qty 100

## 2021-01-11 MED ORDER — FENTANYL CITRATE (PF) 100 MCG/2ML IJ SOLN
25.0000 ug | INTRAMUSCULAR | Status: DC | PRN
  Administered 2021-01-11 (×3): 25 ug via INTRAVENOUS

## 2021-01-11 MED ORDER — BUPIVACAINE-EPINEPHRINE (PF) 0.25% -1:200000 IJ SOLN
INTRAMUSCULAR | Status: AC
  Filled 2021-01-11: qty 30

## 2021-01-11 MED ORDER — IBUPROFEN 800 MG PO TABS: 800.0000 mg | ORAL_TABLET | Freq: Three times a day (TID) | ORAL | 0 refills | Status: DC | PRN

## 2021-01-11 MED ORDER — OXYCODONE HCL 5 MG PO TABS
5.0000 mg | ORAL_TABLET | Freq: Once | ORAL | Status: AC | PRN
Start: 2021-01-11 — End: 2021-01-11
  Administered 2021-01-11: 5 mg via ORAL

## 2021-01-11 MED ORDER — ORAL CARE MOUTH RINSE: 15.0000 mL | Freq: Once | OROMUCOSAL | Status: AC

## 2021-01-11 MED ORDER — PROMETHAZINE HCL 25 MG/ML IJ SOLN: 6.2500 mg | INTRAMUSCULAR | Status: DC | PRN

## 2021-01-11 MED ORDER — MIDAZOLAM HCL 2 MG/2ML IJ SOLN
INTRAMUSCULAR | Status: DC | PRN
  Administered 2021-01-11: 2 mg via INTRAVENOUS

## 2021-01-11 MED ORDER — BUPIVACAINE-EPINEPHRINE (PF) 0.25% -1:200000 IJ SOLN
INTRAMUSCULAR | Status: DC | PRN
  Administered 2021-01-11: 30 mL

## 2021-01-11 MED ORDER — ROCURONIUM BROMIDE 100 MG/10ML IV SOLN
INTRAVENOUS | Status: DC | PRN
  Administered 2021-01-11: 70 mg via INTRAVENOUS
  Administered 2021-01-11: 10 mg via INTRAVENOUS

## 2021-01-11 MED ORDER — MIDAZOLAM HCL 2 MG/2ML IJ SOLN
INTRAMUSCULAR | Status: AC
  Filled 2021-01-11: qty 2

## 2021-01-11 MED ORDER — FAMOTIDINE 20 MG PO TABS: 20.0000 mg | ORAL_TABLET | Freq: Once | ORAL | Status: AC

## 2021-01-11 MED ORDER — LACTATED RINGERS IV SOLN: INTRAVENOUS | Status: DC

## 2021-01-11 MED ORDER — CELECOXIB 200 MG PO CAPS: 200.0000 mg | ORAL_CAPSULE | ORAL | Status: AC

## 2021-01-11 MED ORDER — CHLORHEXIDINE GLUCONATE 0.12 % MT SOLN: 15.0000 mL | Freq: Once | OROMUCOSAL | Status: AC

## 2021-01-11 MED ORDER — INDOCYANINE GREEN 25 MG IV SOLR
2.5000 mg | Freq: Once | INTRAVENOUS | Status: DC
  Filled 2021-01-11: qty 10

## 2021-01-11 MED ORDER — GABAPENTIN 300 MG PO CAPS
ORAL_CAPSULE | ORAL | Status: AC
  Administered 2021-01-11: 300 mg via ORAL
  Filled 2021-01-11: qty 1

## 2021-01-11 MED ORDER — PROPOFOL 10 MG/ML IV BOLUS
INTRAVENOUS | Status: DC | PRN
  Administered 2021-01-11: 200 mg via INTRAVENOUS

## 2021-01-11 MED ORDER — DEXAMETHASONE SODIUM PHOSPHATE 10 MG/ML IJ SOLN
INTRAMUSCULAR | Status: DC | PRN
  Administered 2021-01-11: 10 mg via INTRAVENOUS

## 2021-01-11 MED ORDER — OXYCODONE HCL 5 MG/5ML PO SOLN: 5.0000 mg | Freq: Once | ORAL | Status: AC | PRN

## 2021-01-11 MED ORDER — ONDANSETRON HCL 4 MG/2ML IJ SOLN
INTRAMUSCULAR | Status: DC | PRN
  Administered 2021-01-11: 4 mg via INTRAVENOUS

## 2021-01-11 MED ORDER — FAMOTIDINE 20 MG PO TABS
ORAL_TABLET | ORAL | Status: AC
  Administered 2021-01-11: 20 mg via ORAL
  Filled 2021-01-11: qty 1

## 2021-01-11 MED ORDER — ACETAMINOPHEN 500 MG PO TABS
ORAL_TABLET | ORAL | Status: AC
  Administered 2021-01-11: 1000 mg via ORAL
  Filled 2021-01-11: qty 2

## 2021-01-11 SURGICAL SUPPLY — 53 items
"PENCIL ELECTRO HAND CTR " (MISCELLANEOUS) ×1 IMPLANT
ADH SKN CLS APL DERMABOND .7 (GAUZE/BANDAGES/DRESSINGS) ×1
APL PRP STRL LF DISP 70% ISPRP (MISCELLANEOUS) ×1
BAG SPEC RTRVL LRG 6X4 10 (ENDOMECHANICALS) ×1
CANNULA CAP OBTURATR AIRSEAL 8 (CAP) ×2 IMPLANT
CHLORAPREP W/TINT 26 (MISCELLANEOUS) ×2 IMPLANT
CLIP VESOLOCK LG 6/CT PURPLE (CLIP) ×2 IMPLANT
COVER TIP SHEARS 8 DVNC (MISCELLANEOUS) ×1 IMPLANT
COVER TIP SHEARS 8MM DA VINCI (MISCELLANEOUS) ×1
COVER WAND RF STERILE (DRAPES) ×2 IMPLANT
DECANTER SPIKE VIAL GLASS SM (MISCELLANEOUS) ×2 IMPLANT
DEFOGGER SCOPE WARMER CLEARIFY (MISCELLANEOUS) ×2 IMPLANT
DERMABOND ADVANCED (GAUZE/BANDAGES/DRESSINGS) ×1
DERMABOND ADVANCED .7 DNX12 (GAUZE/BANDAGES/DRESSINGS) ×1 IMPLANT
DRAPE ARM DVNC X/XI (DISPOSABLE) ×4 IMPLANT
DRAPE COLUMN DVNC XI (DISPOSABLE) ×1 IMPLANT
DRAPE DA VINCI XI ARM (DISPOSABLE) ×4
DRAPE DA VINCI XI COLUMN (DISPOSABLE) ×1
ELECT CAUTERY BLADE 6.4 (BLADE) ×2 IMPLANT
GLOVE ORTHO TXT STRL SZ7.5 (GLOVE) ×4 IMPLANT
GOWN STRL REUS W/ TWL LRG LVL3 (GOWN DISPOSABLE) ×4 IMPLANT
GOWN STRL REUS W/TWL LRG LVL3 (GOWN DISPOSABLE) ×8
GRASPER SUT TROCAR 14GX15 (MISCELLANEOUS) ×1 IMPLANT
INFUSOR MANOMETER BAG 3000ML (MISCELLANEOUS) IMPLANT
IRRIGATION STRYKERFLOW (MISCELLANEOUS) IMPLANT
IRRIGATOR STRYKERFLOW (MISCELLANEOUS)
IRRIGATOR SUCT 8 DISP DVNC XI (IRRIGATION / IRRIGATOR) IMPLANT
IRRIGATOR SUCTION 8MM XI DISP (IRRIGATION / IRRIGATOR)
IV NS IRRIG 3000ML ARTHROMATIC (IV SOLUTION) IMPLANT
KIT PINK PAD W/HEAD ARE REST (MISCELLANEOUS) ×2
KIT PINK PAD W/HEAD ARM REST (MISCELLANEOUS) ×1 IMPLANT
KIT TURNOVER KIT A (KITS) ×2 IMPLANT
LABEL OR SOLS (LABEL) ×2 IMPLANT
MANIFOLD NEPTUNE II (INSTRUMENTS) ×2 IMPLANT
NDL INSUFF 14G 150MM VS150000 (NEEDLE) ×1 IMPLANT
NDL INSUFFLATION 14GA 120MM (NEEDLE) IMPLANT
NEEDLE HYPO 22GX1.5 SAFETY (NEEDLE) ×2 IMPLANT
NEEDLE INSUFFLATION 14GA 120MM (NEEDLE) ×2 IMPLANT
NS IRRIG 500ML POUR BTL (IV SOLUTION) ×2 IMPLANT
PACK LAP CHOLECYSTECTOMY (MISCELLANEOUS) ×2 IMPLANT
PENCIL ELECTRO HAND CTR (MISCELLANEOUS) ×2 IMPLANT
POUCH SPECIMEN RETRIEVAL 10MM (ENDOMECHANICALS) ×2 IMPLANT
SEAL CANN UNIV 5-8 DVNC XI (MISCELLANEOUS) ×3 IMPLANT
SEAL XI 5MM-8MM UNIVERSAL (MISCELLANEOUS) ×3
SET TUBE FILTERED XL AIRSEAL (SET/KITS/TRAYS/PACK) ×2 IMPLANT
SOLUTION ELECTROLUBE (MISCELLANEOUS) ×2 IMPLANT
SUT MNCRL 4-0 (SUTURE) ×2
SUT MNCRL 4-0 27XMFL (SUTURE) ×1
SUT MNCRL AB 4-0 PS2 18 (SUTURE) ×2 IMPLANT
SUT VICRYL 0 AB UR-6 (SUTURE) ×2 IMPLANT
SUTURE MNCRL 4-0 27XMF (SUTURE) ×1 IMPLANT
TROCAR 12M 150ML BLUNT (TROCAR) ×1 IMPLANT
TROCAR Z-THREAD FIOS 11X100 BL (TROCAR) ×2 IMPLANT

## 2021-01-11 NOTE — Op Note (Addendum)
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  Post-operative Diagnosis:  Same.  Procedure: Robotic assisted laparoscopic cholecystectomy.  Surgeon: Campbell Lerner, M.D., FACS  Anesthesia: General. with endotracheal tube  Findings: Intrahepatic gallbladder, without acute inflammation.  Without ICG, common duct relationship was not noted.  However cystic duct clips were placed close to the gallbladder neck as feasible.  Estimated Blood Loss: 25 mL         Drains: None         Specimens: Gallbladder           Complications: none  Procedure Details  The patient was seen again in the Holding Room.  No preoperative dose of ICG was administered due to patient shellfish allergy..  The benefits, complications, treatment options, risks and expected outcomes were discussed with the patient. The likelihood of improving the patient's symptoms with return to their baseline status is good.  The patient and/or family concurred with the proposed plan, giving informed consent, again alternatives reviewed.  The patient was taken to Operating Room, identified, and the procedure verified as robotic assisted laparoscopic cholecystectomy.  Prior to the induction of general anesthesia, antibiotic prophylaxis was administered. VTE prophylaxis was in place. General endotracheal anesthesia was then administered and tolerated well. The patient was positioned in the supine position.  After the induction, the abdomen was prepped with Chloraprep and draped in the sterile fashion.  A Time Out was held and the above information confirmed.   After local infiltration of quarter percent Marcaine with epinephrine, stab incision was made left upper quadrant.  Just below the costal margin approximately midclavicular line the Veres needle is passed with sensation of the layers to penetrate the abdominal wall, however, saline drop test is confirmed peritoneal placement,  but the insufflation is initiated with carbon dioxide to rapidly arrive at pressures of 15 mmHg.  I was not confident that I was safely within the peritoneal cavity, so I aborted further attempts at Veress needle placement.  Right infra-umbilical local infiltration with quarter percent Marcaine with epinephrine is utilized.  Made a 12 mm incision on the left periumbilical site, I advanced an optical 53mm extra long port under direct visualization into the peritoneal cavity.  Once the peritoneum was penetrated, insufflation was initiated.  The trocar was then advanced into the abdominal cavity under direct visualization. Pneumoperitoneum was then continued with CO2 at 14 mmHg or less and tolerated well without any adverse changes in the patient's vital signs.  Two 8.5-mm ports were placed in the left lower quadrant and laterally, and one to the right lower quadrant, all under direct vision. All skin incisions  were infiltrated with a local anesthetic agent before making the incision and placing the trocars.  The patient was positioned  in reverse Trendelenburg, tilted the patient's left side down.  Da Vinci XI robot was then positioned on to the patient's left side, and docked. During placement of the robotic instruments, the prograsp was advanced into the operative field.  Upon attempts to advance the scissors through the #3 arm, we immediately identified the patient's degree of reverse Trendelenburg was in excess at 7 degrees, and the omental tissues were obstructing the port outlet, and we could not safely pass the scissors for use. We then determined to switch the the arm assignment to make arm 3, left handed. Out of the ordinary then we utilized the prograsp through arm 3 as a working grasper.  This was done because we anticipated multiple instrument  passes, which would now be through arm for which was readily visible and could be completed safely. The force bipolar was placed through arm 1 and utilized  for retraction. The gallbladder was identified, the fundus grasped via the arm 1 force bipolar and retracted cephalad.  The infundibulum was identified grasped and retracted medial and lateral, due to complicated access secondary to our alternative port usage.  Thereby exposing the peritoneum overlying the triangle of Calot. This was then opened and dissected using cautery & scissors. An extended critical view of the cystic duct was obtained.     The cystic duct was clearly identified and dissected to isolation, and the cystic duct was triple clipped and divided with scissors, leaving two on the remaining stump.  The artery may have been tightly adherent to the cystic duct as further careful dissection with monopolar scissors did not identify another vessel consistent with the cystic artery.  The gallbladder was taken from the gallbladder fossa in a retrograde fashion with the electrocautery.   The gallbladder was removed and placed in an Endocatch bag.  The liver bed is inspected. Hemostasis was confirmed.  The robot was undocked and moved away from the operative field. The gallbladder and Endocatch sac were then removed through the infraumbilical port site.   Inspection of the right upper quadrant was performed. No bleeding, bile duct injury or leak, or bowel injury was noted, with extra attention to the left lower abdominal region where port 3 was placed. The infra-umbilical port site fascia was closed with interrumpted 0 Vicryl sutures using PMI/cone under direct visualization. Pneumoperitoneum was released and ports removed.  4-0 subcuticular Monocryl was used to close the skin. Dermabond was  applied.  The patient was then extubated and brought to the recovery room in stable condition. Sponge, lap, and needle counts were correct at closure and at the conclusion of the case.               Campbell Lerner, M.D., [facility] 01/11/2021 3:28 PM

## 2021-01-11 NOTE — Anesthesia Procedure Notes (Addendum)
Procedure Name: Intubation Date/Time: 01/11/2021 1:15 PM Performed by: Pearla Dubonnet, CRNA Pre-anesthesia Checklist: Patient identified, Emergency Drugs available, Suction available, Patient being monitored and Timeout performed Patient Re-evaluated:Patient Re-evaluated prior to induction Oxygen Delivery Method: Circle system utilized Preoxygenation: Pre-oxygenation with 100% oxygen Induction Type: IV induction Ventilation: Oral airway inserted - appropriate to patient size Laryngoscope Size: McGraph and 3 Grade View: Grade I Tube type: Oral Tube size: 7.0 mm Number of attempts: 1 Airway Equipment and Method: Stylet Placement Confirmation: ETT inserted through vocal cords under direct vision,  positive ETCO2,  CO2 detector and breath sounds checked- equal and bilateral Secured at: 23 cm Tube secured with: Tape Dental Injury: Teeth and Oropharynx as per pre-operative assessment  Difficulty Due To: Difficulty was anticipated, Difficult Airway- due to limited oral opening and Difficult Airway- due to large tongue

## 2021-01-11 NOTE — Progress Notes (Signed)
Spoke with Dr. Claudine Mouton about contraindications with shellfish allergy and IC-green.  Dr. Claudine Mouton said to not give based on potential risk for reaction.

## 2021-01-11 NOTE — Discharge Instructions (Addendum)

## 2021-01-11 NOTE — Anesthesia Preprocedure Evaluation (Signed)
Anesthesia Evaluation  Patient identified by MRN, date of birth, ID band Patient awake    Reviewed: Allergy & Precautions, NPO status , Patient's Chart, lab work & pertinent test results  History of Anesthesia Complications Negative for: history of anesthetic complications  Airway Mallampati: III  TM Distance: >3 FB Neck ROM: Full    Dental  (+) Poor Dentition   Pulmonary asthma (daily controller) , sleep apnea and Continuous Positive Airway Pressure Ventilation ,    breath sounds clear to auscultation- rhonchi (-) wheezing      Cardiovascular Exercise Tolerance: Good (-) hypertension(-) CAD, (-) Past MI, (-) Cardiac Stents and (-) CABG  Rhythm:Regular Rate:Normal - Systolic murmurs and - Diastolic murmurs    Neuro/Psych neg Seizures PSYCHIATRIC DISORDERS Depression negative neurological ROS     GI/Hepatic negative GI ROS, Neg liver ROS,   Endo/Other  negative endocrine ROSneg diabetes  Renal/GU negative Renal ROS     Musculoskeletal negative musculoskeletal ROS (+)   Abdominal (+) + obese,   Peds  Hematology negative hematology ROS (+)   Anesthesia Other Findings Past Medical History: No date: Asthma No date: Depression   Reproductive/Obstetrics                             Anesthesia Physical Anesthesia Plan  ASA: III  Anesthesia Plan: General   Post-op Pain Management:    Induction: Intravenous  PONV Risk Score and Plan: 2  Airway Management Planned: Oral ETT  Additional Equipment:   Intra-op Plan:   Post-operative Plan: Extubation in OR  Informed Consent: I have reviewed the patients History and Physical, chart, labs and discussed the procedure including the risks, benefits and alternatives for the proposed anesthesia with the patient or authorized representative who has indicated his/her understanding and acceptance.     Dental advisory given  Plan Discussed with:  CRNA and Anesthesiologist  Anesthesia Plan Comments:         Anesthesia Quick Evaluation

## 2021-01-11 NOTE — Interval H&P Note (Signed)
History and Physical Interval Note:  01/11/2021 12:28 PM  Erica Webster  has presented today for surgery, with the diagnosis of robotic cholecystectomy.  The various methods of treatment have been discussed with the patient and family. After consideration of risks, benefits and other options for treatment, the patient has consented to  Procedure(s): XI ROBOTIC ASSISTED LAPAROSCOPIC CHOLECYSTECTOMY (N/A) as a surgical intervention.  The patient's history has been reviewed, patient examined, no change in status, stable for surgery.  I have reviewed the patient's chart and labs.  Questions were answered to the patient's satisfaction.    Will forgo use of ICG to err on the side of safety considering her shellfish allergy.   Campbell Lerner

## 2021-01-11 NOTE — Transfer of Care (Signed)
Immediate Anesthesia Transfer of Care Note  Patient: Erica Webster  Procedure(s) Performed: XI ROBOTIC ASSISTED LAPAROSCOPIC CHOLECYSTECTOMY (N/A )  Patient Location: PACU  Anesthesia Type:General  Level of Consciousness: drowsy  Airway & Oxygen Therapy: Patient Spontanous Breathing and Patient connected to face mask oxygen  Post-op Assessment: Report given to RN and Post -op Vital signs reviewed and stable  Post vital signs: Reviewed and stable  Last Vitals:  Vitals Value Taken Time  BP 125/85 01/11/21 1524  Temp    Pulse 88 01/11/21 1527  Resp 21 01/11/21 1527  SpO2 100 % 01/11/21 1527  Vitals shown include unvalidated device data.  Last Pain: There were no vitals filed for this visit.       Complications: No complications documented.

## 2021-01-12 NOTE — Anesthesia Postprocedure Evaluation (Signed)
Anesthesia Post Note  Patient: Jazman Reuter  Procedure(s) Performed: XI ROBOTIC ASSISTED LAPAROSCOPIC CHOLECYSTECTOMY (N/A )  Patient location during evaluation: PACU Anesthesia Type: General Level of consciousness: awake and alert and oriented Pain management: pain level controlled Vital Signs Assessment: post-procedure vital signs reviewed and stable Respiratory status: spontaneous breathing, nonlabored ventilation and respiratory function stable Cardiovascular status: blood pressure returned to baseline and stable Postop Assessment: no signs of nausea or vomiting Anesthetic complications: no   No complications documented.   Last Vitals:  Vitals:   01/11/21 1652 01/11/21 1712  BP: 138/78 130/81  Pulse: (!) 103 (!) 101  Resp: 18 16  Temp: (!) 36.2 C   SpO2: 96% 95%    Last Pain:  Vitals:   01/11/21 1712  TempSrc:   PainSc: 5                  Terin Cragle

## 2021-01-13 LAB — SURGICAL PATHOLOGY

## 2021-01-15 ENCOUNTER — Other Ambulatory Visit: Admission: RE | Admit: 2021-01-15 | Payer: Medicare Other | Source: Ambulatory Visit

## 2021-01-26 ENCOUNTER — Other Ambulatory Visit: Payer: Self-pay

## 2021-01-26 ENCOUNTER — Encounter: Payer: Self-pay | Admitting: Surgery

## 2021-01-26 ENCOUNTER — Ambulatory Visit (INDEPENDENT_AMBULATORY_CARE_PROVIDER_SITE_OTHER): Payer: Medicare Other | Admitting: Surgery

## 2021-01-26 VITALS — BP 140/93 | HR 96 | Temp 98.7°F | Ht 62.0 in | Wt 337.0 lb

## 2021-01-26 DIAGNOSIS — Z9049 Acquired absence of other specified parts of digestive tract: Secondary | ICD-10-CM

## 2021-01-26 NOTE — Patient Instructions (Addendum)
The diarrhea should improve in the next 2 months. Please call us if this does not happen. The nausea should get better too. Be aware of what your body can tolerate. Walk more, this will help with recovery.  Follow-up with our office as needed.  Please call and ask to speak with a nurse if you develop questions or concerns.   GENERAL POST-OPERATIVE PATIENT INSTRUCTIONS   WOUND CARE INSTRUCTIONS: Try to keep the wound dry and avoid ointments on the wound unless directed to do so.  If the wound becomes bright red and painful or starts to drain infected material that is not clear, please contact your physician immediately.  If the wound is mildly pink and has a thick firm ridge underneath it, this is normal, and is referred to as a healing ridge.  This will resolve over the next 4-6 weeks.  BATHING: You may shower if you have been informed of this by your surgeon. However, Please do not submerge in a tub, hot tub, or pool until incisions are completely sealed or have been told by your surgeon that you may do so.  DIET:  You may eat any foods that you can tolerate.  It is a good idea to eat a high fiber diet and take in plenty of fluids to prevent constipation.  If you do become constipated you may want to take a mild laxative or take ducolax tablets on a daily basis until your bowel habits are regular.  Constipation can be very uncomfortable, along with straining, after recent surgery.  ACTIVITY: You may want to hug a pillow when coughing and sneezing to add additional support to the surgical area, if you had abdominal or chest surgery, which will decrease pain during these times.  You are encouraged to walk and engage in light activity for the next two weeks.  You should not lift more than 20 pounds for 6 weeks after surgery as it could put you at increased risk for complications.  Twenty pounds is roughly equivalent to a plastic bag of groceries. At that time- Listen to your body when lifting, if you  have pain when lifting, stop and then try again in a few days. Soreness after doing exercises or activities of daily living is normal as you get back in to your normal routine.  MEDICATIONS:  Try to take narcotic medications and anti-inflammatory medications, such as tylenol, ibuprofen, naprosyn, etc., with food.  This will minimize stomach upset from the medication.  Should you develop nausea and vomiting from the pain medication, or develop a rash, please discontinue the medication and contact your physician.  You should not drive, make important decisions, or operate machinery when taking narcotic pain medication.  SUNBLOCK Use sun block to incision area over the next year if this area will be exposed to sun. This helps decrease scarring and will allow you avoid a permanent darkened area over your incision.  QUESTIONS:  Please feel free to call our office if you have any questions, and we will be glad to assist you.

## 2021-01-26 NOTE — Progress Notes (Signed)
[facility] POST-OP OFFICE VISIT  01/26/2021  HPI: Jolana Runkles is a 35 y.o. female 15 days s/p robotic cholecystectomy.  She reports some of her incisions are itchy.  She denies redness and drainage.  She reports she is eating well, is no longer taking pain medications as her pain is resolved.  She notes some diminishment in her appetite with some loose stools every other day.  Vital signs: BP (!) 140/93   Pulse 96   Temp 98.7 F (37.1 C)   Ht 5\' 2"  (1.575 m)   Wt (!) 337 lb (152.9 kg)   LMP 12/28/2020   SpO2 97%   BMI 61.64 kg/m    Physical Exam: Constitutional: She appears well. Abdomen: Soft nontender. Skin: Incisions all have a small thin crust covering it.  There is the expected amount of hyperemia immediately adjacent.  But no evidence of infection or discharge present.  Assessment/Plan: This is a 35 y.o. female 15 days s/p robotic cholecystectomy.  Patient Active Problem List   Diagnosis Date Noted  . CCC (chronic calculous cholecystitis) 12/10/2020  . Severe obesity (BMI >= 40) (HCC) 12/10/2020    -We discussed gradual progression in her activities, return to her group session/daily life course.  We will be glad to see her again as needed.   02/09/2021 M.D., FACS 01/26/2021, 10:34 AM

## 2021-02-02 ENCOUNTER — Ambulatory Visit (INDEPENDENT_AMBULATORY_CARE_PROVIDER_SITE_OTHER): Payer: Medicare Other | Admitting: Surgery

## 2021-02-02 ENCOUNTER — Other Ambulatory Visit: Payer: Self-pay

## 2021-02-02 ENCOUNTER — Encounter: Payer: Self-pay | Admitting: Surgery

## 2021-02-02 VITALS — BP 158/106 | HR 101 | Temp 98.8°F | Ht 62.0 in | Wt 329.0 lb

## 2021-02-02 DIAGNOSIS — R1013 Epigastric pain: Secondary | ICD-10-CM

## 2021-02-02 NOTE — Patient Instructions (Signed)
If you have any concerns or questions, please feel free to call our office.    Bland Diet A bland diet consists of foods that are often soft and do not have a lot of fat, fiber, or extra seasonings. Foods without fat, fiber, or seasoning are easier for the body to digest. They are also less likely to irritate your mouth, throat, stomach, and other parts of your digestive system. A bland diet is sometimes called a BRAT diet. What is my plan? Your health care provider or food and nutrition specialist (dietitian) may recommend specific changes to your diet to prevent symptoms or to treat your symptoms. These changes may include:  Eating small meals often.  Cooking food until it is soft enough to chew easily.  Chewing your food well.  Drinking fluids slowly.  Not eating foods that are very spicy, sour, or fatty.  Not eating citrus fruits, such as oranges and grapefruit. What do I need to know about this diet?  Eat a variety of foods from the bland diet food list.  Do not follow a bland diet longer than needed.  Ask your health care provider whether you should take vitamins or supplements. What foods can I eat? Grains Hot cereals, such as cream of wheat. Rice. Bread, crackers, or tortillas made from refined white flour.   Vegetables Canned or cooked vegetables. Mashed or boiled potatoes. Fruits Bananas. Applesauce. Other types of cooked or canned fruit with the skin and seeds removed, such as canned peaches or pears.   Meats and other proteins Scrambled eggs. Creamy peanut butter or other nut butters. Lean, well-cooked meats, such as chicken or fish. Tofu. Soups or broths.   Dairy Low-fat dairy products, such as milk, cottage cheese, or yogurt. Beverages Water. Herbal tea. Apple juice.   Fats and oils Mild salad dressings. Canola or olive oil. Sweets and desserts Pudding. Custard. Fruit gelatin. Ice cream. The items listed above may not be a complete list of recommended foods  and beverages. Contact a dietitian for more options. What foods are not recommended? Grains Whole grain breads and cereals. Vegetables Raw vegetables. Fruits Raw fruits, especially citrus, berries, or dried fruits. Dairy Whole fat dairy foods. Beverages Caffeinated drinks. Alcohol. Seasonings and condiments Strongly flavored seasonings or condiments. Hot sauce. Salsa. Other foods Spicy foods. Fried foods. Sour foods, such as pickled or fermented foods. Foods with high sugar content. Foods high in fiber. The items listed above may not be a complete list of foods and beverages to avoid. Contact a dietitian for more information. Summary  A bland diet consists of foods that are often soft and do not have a lot of fat, fiber, or extra seasonings.  Foods without fat, fiber, or seasoning are easier for the body to digest.  Check with your health care provider to see how long you should follow this diet plan. It is not meant to be followed for long periods. This information is not intended to replace advice given to you by your health care provider. Make sure you discuss any questions you have with your health care provider. Document Revised: 10/18/2017 Document Reviewed: 10/18/2017 Elsevier Patient Education  2021 [employer]      Diarrhea, Adult Diarrhea is when you pass loose and watery poop (stool) often. Diarrhea can make you feel weak and cause you to lose water in your body (get dehydrated). Losing water in your body can cause you to:  Feel tired and thirsty.  Have a dry mouth.  Go  pee (urinate) less often. Diarrhea often lasts 2-3 days. However, it can last longer if it is a sign of something more serious. It is important to treat your diarrhea as told by your doctor. Follow these instructions at home: Eating and drinking Follow these instructions as told by your doctor:  Take an ORS (oral rehydration solution). This is a drink that helps you replace fluids and  minerals your body lost. It is sold at pharmacies and stores.  Drink plenty of fluids, such as: ? Water. ? Ice chips. ? Diluted fruit juice. ? Low-calorie sports drinks. ? Milk, if you want.  Avoid drinking fluids that have a lot of sugar or caffeine in them.  Eat bland, easy-to-digest foods in small amounts as you are able. These foods include: ? Bananas. ? Applesauce. ? Rice. ? Low-fat (lean) meats. ? Toast. ? Crackers.  Avoid alcohol.  Avoid spicy or fatty foods.      Medicines  Take over-the-counter and prescription medicines only as told by your doctor.  If you were prescribed an antibiotic medicine, take it as told by your doctor. Do not stop using the antibiotic even if you start to feel better. General instructions  Wash your hands often using soap and water. If soap and water are not available, use a hand sanitizer. Others in your home should wash their hands as well. Hands should be washed: ? After using the toilet or changing a diaper. ? Before preparing, cooking, or serving food. ? While caring for a sick person. ? While visiting someone in a hospital.  Drink enough fluid to keep your pee (urine) pale yellow.  Rest at home while you get better.  Watch your condition for any changes.  Take a warm bath to help with any burning or pain from having diarrhea.  Keep all follow-up visits as told by your doctor. This is important.   Contact a doctor if:  You have a fever.  Your diarrhea gets worse.  You have new symptoms.  You cannot keep fluids down.  You feel light-headed or dizzy.  You have a headache.  You have muscle cramps. Get help right away if:  You have chest pain.  You feel very weak or you pass out (faint).  You have bloody or black poop or poop that looks like tar.  You have very bad pain, cramping, or bloating in your belly (abdomen).  You have trouble breathing or you are breathing very quickly.  Your heart is beating very  quickly.  Your skin feels cold and clammy.  You feel confused.  You have signs of losing too much water in your body, such as: ? Dark pee, very little pee, or no pee. ? Cracked lips. ? Dry mouth. ? Sunken eyes. ? Sleepiness. ? Weakness. Summary  Diarrhea is when you pass loose and watery poop (stool) often.  Diarrhea can make you feel weak and cause you to lose water in your body (get dehydrated).  Take an ORS (oral rehydration solution). This is a drink that is sold at pharmacies and stores.  Eat bland, easy-to-digest foods in small amounts as you are able.  Contact a doctor if your condition gets worse. Get help right away if you have signs that you have lost too much water in your body. This information is not intended to replace advice given to you by your health care provider. Make sure you discuss any questions you have with your health care provider. Document Revised: 02/23/2018 Document Reviewed:  02/23/2018 Elsevier Patient Education  2021 [employer].

## 2021-02-02 NOTE — Progress Notes (Signed)
[facility] POST-OP OFFICE VISIT  02/02/2021  HPI: Erica Webster is a 35 y.o. female 22 days s/p robotic cholecystectomy.  She reports that while she was preparing her meal of baked chicken and Brussels sprouts, she had a pain in the epigastrium that was a 10 out of 10, lasted a few hours.  She took some Tylenol and ibuprofen without improvement.  She was able to eat her Brussels sprouts and had a diarrhea stool shortly after.  The pain gradually subsided but never fully went away until she realized it was gone this morning.  She denies nausea, vomiting, fevers and chills.  She does not keep to a good of track of how her bowel movements have been of late.  What she describes about her stools are unremarkable, typical brown color.   She admits she has had multiple trips to the emergency department of late.  She reports loud bowel sounds.  She has had prior episodes of indigestion.  Vital signs: BP (!) 158/106   Pulse (!) 101   Temp 98.8 F (37.1 C) (Oral)   Ht 5\' 2"  (1.575 m)   Wt (!) 329 lb (149.2 kg)   LMP 02/01/2021   SpO2 96%   BMI 60.17 kg/m    Physical Exam: Constitutional: She appears well today, nontoxic. Abdomen: Obese, no appreciable tenderness, no evidence of guarding.  Incisions are clean, dry and intact. Skin: No subcutaneous masses, nor bulges on Valsalva.  Assessment/Plan: This is a 35 y.o. female 22 days s/p robotic cholecystectomy.  Episodic epigastric pain, in preprandial state, while preparing her meal.  Transient.  Patient Active Problem List   Diagnosis Date Noted  . Status post laparoscopic cholecystectomy 01/26/2021  . Severe obesity (BMI >= 40) (HCC) 12/10/2020    -Reassurance is given about intestinal irregularity following cholecystectomy surgery.  Reassurances and advice for recurrence to present to ED if persisting.  We will be glad to see her back as needed.   02/09/2021 M.D., FACS 02/02/2021, 11:36 AM

## 2021-02-11 ENCOUNTER — Telehealth: Payer: Self-pay

## 2021-02-11 NOTE — Telephone Encounter (Signed)
Patient stated her PCP was going to put in a referral to Bariatric weight loss  She wanted the contact number for [facility] [place]-number was provided.

## 2021-02-15 ENCOUNTER — Ambulatory Visit: Payer: Medicare Other

## 2021-02-15 ENCOUNTER — Ambulatory Visit (INDEPENDENT_AMBULATORY_CARE_PROVIDER_SITE_OTHER): Payer: Medicare Other | Admitting: Internal Medicine

## 2021-02-15 VITALS — BP 142/78 | HR 94 | Resp 16 | Ht 62.0 in | Wt 320.0 lb

## 2021-02-15 DIAGNOSIS — Z9989 Dependence on other enabling machines and devices: Secondary | ICD-10-CM

## 2021-02-15 DIAGNOSIS — Z7189 Other specified counseling: Secondary | ICD-10-CM

## 2021-02-15 DIAGNOSIS — G4733 Obstructive sleep apnea (adult) (pediatric): Secondary | ICD-10-CM | POA: Insufficient documentation

## 2021-02-15 NOTE — Progress Notes (Signed)
[facility] [address] [place], [place] [address]  Pulmonary Sleep Medicine   Office Visit Note  Patient Name: Erica Webster DOB: Jun 21, 1986 MRN [id number]    Chief Complaint: Obstructive Sleep Apnea visit  Brief History:  Erica Webster is seen today for compliance after initial set up The patient has a 5 month history of sleep apnea. Patient is not using PAP nightly.  The patient feels better after sleeping with PAP.  The patient reports mask problems from PAP use. Patient reports some sleepiness during the day and the Epworth Sleepiness Score is 6 out of 24. The patient does  take naps. The patient complains of the following: She is struggling with her mask, feels uncomfortable in the current mask. She is using a full face mask currently.   The compliance download shows  compliance with an average use time of 1:38 hours. The AHI is 0.3  The patient does not complain  of limb movements disrupting sleep.  ROS  General: (-) fever, (-) chills, (-) night sweat Nose and Sinuses: (-) nasal stuffiness or itchiness, (-) postnasal drip, (-) nosebleeds, (-) sinus trouble. Mouth and Throat: (-) sore throat, (-) hoarseness. Neck: (-) swollen glands, (-) enlarged thyroid, (-) neck pain. Respiratory: - cough, - shortness of breath, - wheezing. Neurologic: - numbness, - tingling. Psychiatric: - anxiety, + depression   Current Medication: Outpatient Encounter Medications as of 02/15/2021  Medication Sig  . albuterol (PROVENTIL HFA;VENTOLIN HFA) 108 (90 Base) MCG/ACT inhaler Inhale 2 puffs into the lungs every 4 (four) hours as needed for wheezing or shortness of breath.  Marland Kitchen albuterol (PROVENTIL) (2.5 MG/3ML) 0.083% nebulizer solution Take 2.5 mg by nebulization every 4 (four) hours as needed.  . ARIPiprazole (ABILIFY) 10 MG tablet Take 1.5 tablets (15 mg total) by mouth daily.  . Cholecalciferol (VITAMIN D3) 50 MCG (2000 UT) TABS Take 2,000 Units by mouth daily.  Marland Kitchen FLOVENT HFA 110  MCG/ACT inhaler Inhale 2 puffs into the lungs 2 (two) times daily.  Marland Kitchen ibuprofen (ADVIL) 800 MG tablet Take 1 tablet (800 mg total) by mouth every 8 (eight) hours as needed.  . montelukast (SINGULAIR) 10 MG tablet Take 1 tablet by mouth daily.  Marland Kitchen Spacer/Aero Chamber Mouthpiece MISC 1 Units by Does not apply route every 4 (four) hours as needed (wheezing).  . vitamin B-12 (CYANOCOBALAMIN) 500 MCG tablet Take 500 mcg by mouth daily.   No facility-administered encounter medications on file as of 02/15/2021.    Surgical History: Past Surgical History:  Procedure Laterality Date  . TOENAIL EXCISION      Medical History: Past Medical History:  Diagnosis Date  . Asthma   . Depression     Family History: Non contributory to the present illness  Social History: Social History   Socioeconomic History  . Marital status: Single    Spouse name: Not on file  . Number of children: Not on file  . Years of education: Not on file  . Highest education level: Not on file  Occupational History  . Not on file  Tobacco Use  . Smoking status: Never Smoker  . Smokeless tobacco: Never Used  Vaping Use  . Vaping Use: Never used  Substance and Sexual Activity  . Alcohol use: No  . Drug use: No  . Sexual activity: Not on file  Other Topics Concern  . Not on file  Social History Narrative  . Not on file   Social Determinants of Health   Financial Resource Strain: Not on  file  Food Insecurity: Not on file  Transportation Needs: Not on file  Physical Activity: Not on file  Stress: Not on file  Social Connections: Not on file  Intimate Partner Violence: Not on file    Vital Signs: Blood pressure (!) 142/78, pulse 94, resp. rate 16, height 5\' 2"  (1.575 m), weight (!) 320 lb (145.2 kg), last menstrual period 02/01/2021, SpO2 97 %.  Examination: General Appearance: The patient is well-developed, well-nourished, and in no distress. Neck Circumference: 38 Skin: Gross inspection of skin  unremarkable. Head: normocephalic, no gross deformities. Eyes: no gross deformities noted. ENT: ears appear grossly normal Neurologic: Alert and oriented. No involuntary movements.    EPWORTH SLEEPINESS SCALE:  Scale:  (0)= no chance of dozing; (1)= slight chance of dozing; (2)= moderate chance of dozing; (3)= high chance of dozing  Chance  Situtation    Sitting and reading: 0    Watching TV:1    Sitting Inactive in public1    As a passenger in car: 0    Lying down to rest: 3    Sitting and talking: 0    Sitting quielty after lunch:1    In a car, stopped in traffic: 0   TOTAL SCORE:6 out of 24    SLEEP STUDIES:  Split 10/01/2020  -  AHI 49.8 (REM AHI 102.9),  Min SpO2 62%   CPAP COMPLIANCE DATA:  Date Range: 01/14/21 - 02/12/21   Average Daily Use: 1:38 hours  Median Use: 3:30 hours  Compliance for > 4 Hours: 13%  AHI: 0.3 respiratory events per hour  Days Used: 12/30 days   Mask Leak: 33.1  95th Percentile Pressure: 13 cmH2O         LABS: Recent Results (from the past 2160 hour(s))  Urinalysis, Complete w Microscopic     Status: Abnormal   Collection Time: 12/07/20  7:17 PM  Result Value Ref Range   Color, Urine YELLOW (A) YELLOW   APPearance CLOUDY (A) CLEAR   Specific Gravity, Urine 1.026 1.005 - 1.030   pH 5.0 5.0 - 8.0   Glucose, UA NEGATIVE NEGATIVE mg/dL   Hgb urine dipstick MODERATE (A) NEGATIVE   Bilirubin Urine NEGATIVE NEGATIVE   Ketones, ur NEGATIVE NEGATIVE mg/dL   Protein, ur NEGATIVE NEGATIVE mg/dL   Nitrite NEGATIVE NEGATIVE   Leukocytes,Ua SMALL (A) NEGATIVE   RBC / HPF 0-5 0 - 5 RBC/hpf   WBC, UA 6-10 0 - 5 WBC/hpf   Bacteria, UA FEW (A) NONE SEEN   Squamous Epithelial / LPF 11-20 0 - 5   Mucus PRESENT     Comment: Performed at [facility], [address] [place], [place] [address]  Lipase, blood     Status: None   Collection Time: 12/07/20  7:43 PM  Result Value Ref Range   Lipase 24 11 - 51 U/L     Comment: Performed at [facility], [address] [place], [place] [address]  Comprehensive metabolic panel     Status: None   Collection Time: 12/07/20  7:43 PM  Result Value Ref Range   Sodium 137 135 - 145 mmol/L   Potassium 4.0 3.5 - 5.1 mmol/L   Chloride 103 98 - 111 mmol/L   CO2 27 22 - 32 mmol/L   Glucose, Bld 88 70 - 99 mg/dL    Comment: Glucose reference range applies only to samples taken after fasting for at least 8 hours.   BUN 13 6 - 20 mg/dL  Creatinine, Ser 0.93 0.44 - 1.00 mg/dL   Calcium 8.9 8.9 - 63.8 mg/dL   Total Protein 7.7 6.5 - 8.1 g/dL   Albumin 4.0 3.5 - 5.0 g/dL   AST 17 15 - 41 U/L   ALT 18 0 - 44 U/L   Alkaline Phosphatase 106 38 - 126 U/L   Total Bilirubin 0.7 0.3 - 1.2 mg/dL   GFR, Estimated >93 >73 mL/min    Comment: (NOTE) Calculated using the CKD-EPI Creatinine Equation (2021)    Anion gap 7 5 - 15    Comment: Performed at [facility] [address] [place], [place] [address]  CBC     Status: Abnormal   Collection Time: 12/07/20  7:43 PM  Result Value Ref Range   WBC 6.0 4.0 - 10.5 K/uL   RBC 4.71 3.87 - 5.11 MIL/uL   Hemoglobin 11.6 (L) 12.0 - 15.0 g/dL   HCT 81.1 57.2 - 62.0 %   MCV 83.0 80.0 - 100.0 fL   MCH 24.6 (L) 26.0 - 34.0 pg   MCHC 29.7 (L) 30.0 - 36.0 g/dL   RDW 35.5 97.4 - 16.3 %   Platelets 302 150 - 400 K/uL   nRBC 0.0 0.0 - 0.2 %    Comment: Performed at [facility], [address] [place], [place] [address]  POC urine preg, ED     Status: None   Collection Time: 12/07/20  8:24 PM  Result Value Ref Range   Preg Test, Ur NEGATIVE NEGATIVE    Comment:        THE SENSITIVITY OF THIS METHODOLOGY IS >24 mIU/mL   SARS CORONAVIRUS 2 (TAT 6-24 HRS) Nasopharyngeal Nasopharyngeal Swab     Status: None   Collection Time: 01/07/21  9:55 AM   Specimen: Nasopharyngeal Swab  Result Value Ref Range   SARS Coronavirus 2 NEGATIVE NEGATIVE    Comment: (NOTE) SARS-CoV-2 target nucleic acids are NOT  DETECTED.  The SARS-CoV-2 RNA is generally detectable in upper and lower respiratory specimens during the acute phase of infection. Negative results do not preclude SARS-CoV-2 infection, do not rule out co-infections with other pathogens, and should not be used as the sole basis for treatment or other patient management decisions. Negative results must be combined with clinical observations, patient history, and epidemiological information. The expected result is Negative.  Fact Sheet for Patients: HairSlick.no  Fact Sheet for Healthcare Providers: quierodirigir.com  This test is not yet approved or cleared by the [place] FDA and  has been authorized for detection and/or diagnosis of SARS-CoV-2 by FDA under an Emergency Use Authorization (EUA). This EUA will remain  in effect (meaning this test can be used) for the duration of the COVID-19 declaration under Se ction 564(b)(1) of the Act, 21 U.S.C. section 360bbb-3(b)(1), unless the authorization is terminated or revoked sooner.  Performed at [facility], [address] [place], [place] [address]   Pregnancy, urine POC     Status: None   Collection Time: 01/11/21 11:57 AM  Result Value Ref Range   Preg Test, Ur NEGATIVE NEGATIVE    Comment:        THE SENSITIVITY OF THIS METHODOLOGY IS >24 mIU/mL   Surgical pathology     Status: None   Collection Time: 01/11/21 12:57 PM  Result Value Ref Range   SURGICAL PATHOLOGY      SURGICAL PATHOLOGY CASE: [id number] PATIENT: Verl Bangs Surgical Pathology Report     Specimen Submitted: A. Gallbladder  Clinical History: Robotic cholecystectomy      DIAGNOSIS: A. GALLBLADDER; CHOLECYSTECTOMY: - CHRONIC CHOLECYSTITIS AND CHOLELITHIASIS.   GROSS DESCRIPTION: A. Labeled: Gallbladder Received: Formalin Collection time: 2:31 PM on 01/11/2021 Placed into formalin time: 2:36 PM on 01/11/2021 Size of  specimen: 8.2 x 3.6 x 2.7 cm Specimen integrity: Intact External surface: The serosa is tan-green, smooth, and glistening with a roughened hepatic bed. Wall thickness: Ranges from 0.2-0.3 cm Mucosa: The mucosa is tan-green and velvety with scattered areas of cholesterolosis.  There is 0.2 x 0.2 x 0.1 cm raised potential polyp and a 0.3 x 0.1 x 0.1 cm polypoid area of cholesterolosis. Cystic duct: The cystic duct is 2.5 x 0.8 x 0.2 cm.  The duct is partially obstructed by a small stone.  A distinct adjacent lymph node candidate is not  grossly appreciated. Bile present: There is tan-green viscous bile. Stones present: There are 2 stones ranging from 0.4 to 2.5 cm in greatest dimension.  The stones are green-brown, lobulated, and firm. Other findings: None grossly appreciated.  Block summary: 1 - cystic duct resection margin, en face and inked blue, and representative wall with entire potential polyp and pedunculated cholesterolosis  RB 01/12/2021  Final Diagnosis performed by Redmond Pulling, MD.   Electronically signed 01/13/2021 10:37:46AM The electronic signature indicates that the named Attending Pathologist has evaluated the specimen Technical component performed at [facility], [address], [place], [place] [address] Lab: [phone] Dir: Jolene Schimke, MD, MMM  Professional component performed at [facility], [address] [place], [place], [place] [address] Lab: [phone] Dir: Georgiann Cocker. Oneita Kras, MD     Radiology: No results found.  No results found.  No results found.    Assessment and Plan: Patient Active Problem List   Diagnosis Date Noted  . OSA on CPAP 02/15/2021  . CPAP use counseling 02/15/2021  . Morbid obesity (HCC) 02/15/2021  . Status post laparoscopic cholecystectomy 01/26/2021  . Severe obesity (BMI >= 40) (HCC) 12/10/2020    1. OSA on CPAP The patient does tolerate PAP and reports  benefit from PAP use. The patient was  educated in proper mask usage and reported that she understood better how to use her equipment. She does feel more rested after use, appetite is better controlled also. Will plan to see her back in one month to document and assess compliance which is still poor. AHI is 0.3  OSA- improve compliance with cpap at 13 cm H20.     2. CPAP use counseling CPAP Counseling: had a lengthy discussion with the patient regarding the importance of PAP therapy in management of the sleep apnea. Patient appears to understand the risk factor reduction and also understands the risks associated with untreated sleep apnea. Patient will try to make a good faith effort to remain compliant with therapy. Also instructed the patient on proper cleaning of the device including the water must be changed daily if possible and use of distilled water is preferred. Patient understands that the machine should be regularly cleaned with appropriate recommended cleaning solutions that do not damage the PAP machine for example given white vinegar and water rinses. Other methods such as ozone treatment may not be as good as these simple methods to achieve cleaning.  3. Morbid obesity (HCC) Obesity Counseling: Had a lengthy discussion regarding patients BMI and weight issues. Patient was instructed on portion control as well as increased activity. Also discussed caloric restrictions with trying to maintain intake less than 2000 Kcal. Discussions were made  in accordance with the 5As of weight management. Simple actions such as not eating late and if able to, taking a walk is suggested.  General Counseling: I have discussed the findings of the evaluation and examination with Erica Webster.  I have also discussed any further diagnostic evaluation thatmay be needed or ordered today. Erica Webster verbalizes understanding of the findings of todays visit. We also reviewed her medications today and discussed drug interactions and side effects including but not  limited excessive drowsiness and altered mental states. We also discussed that there is always a risk not just to her but also people around her. she has been encouraged to call the office with any questions or concerns that should arise related to todays visit.  No orders of the defined types were placed in this encounter.       I have personally obtained a history, examined the patient, evaluated laboratory and imaging results, formulated the assessment and plan and placed orders.   Valentino Hue Sol Blazing, PhD, FAASM  Diplomate, American Board of Sleep Medicine    Yevonne Pax, MD [facility] Diplomate ABMS Pulmonary and Critical Care Medicine Sleep medicine

## 2021-02-15 NOTE — Progress Notes (Deleted)
[facility] [address] [place], [place] [address]  Pulmonary Sleep Medicine   Office Visit Note  Patient Name: Erica Webster DOB: 04/12/1986 MRN [id number]    Chief Complaint: Obstructive Sleep Apnea visit  Brief History:  Milo is seen today for *** The patient has a *** history of sleep apnea. Patient *** using PAP nightly.  The patient feels *** after sleeping with PAP.  The patient reports *** from PAP use. Reported sleepiness is  *** and the Epworth Sleepiness Score is *** out of 24. The patient *** take naps. The patient complains of the following: ***  The compliance download shows  compliance with an average use time of *** hours. The AHI is ***  The patient *** of limb movements disrupting sleep.  ROS  General: (-) fever, (-) chills, (-) night sweat Nose and Sinuses: (-) nasal stuffiness or itchiness, (-) postnasal drip, (-) nosebleeds, (-) sinus trouble. Mouth and Throat: (-) sore throat, (-) hoarseness. Neck: (-) swollen glands, (-) enlarged thyroid, (-) neck pain. Respiratory: *** cough, *** shortness of breath, *** wheezing. Neurologic: *** numbness, *** tingling. Psychiatric: *** anxiety, *** depression   Current Medication: Outpatient Encounter Medications as of 02/15/2021  Medication Sig  . albuterol (PROVENTIL HFA;VENTOLIN HFA) 108 (90 Base) MCG/ACT inhaler Inhale 2 puffs into the lungs every 4 (four) hours as needed for wheezing or shortness of breath.  . ARIPiprazole (ABILIFY) 10 MG tablet Take 1.5 tablets (15 mg total) by mouth daily.  . Cholecalciferol (VITAMIN D3) 50 MCG (2000 UT) TABS Take 2,000 Units by mouth daily.  Marland Kitchen FLOVENT HFA 110 MCG/ACT inhaler Inhale 2 puffs into the lungs 2 (two) times daily.  Marland Kitchen ibuprofen (ADVIL) 800 MG tablet Take 1 tablet (800 mg total) by mouth every 8 (eight) hours as needed.  Marland Kitchen Spacer/Aero Chamber Mouthpiece MISC 1 Units by Does not apply route every 4 (four) hours as needed (wheezing).  . vitamin B-12  (CYANOCOBALAMIN) 500 MCG tablet Take 500 mcg by mouth daily.   No facility-administered encounter medications on file as of 02/15/2021.    Surgical History: Past Surgical History:  Procedure Laterality Date  . TOENAIL EXCISION      Medical History: Past Medical History:  Diagnosis Date  . Asthma   . Depression     Family History: Non contributory to the present illness  Social History: Social History   Socioeconomic History  . Marital status: Single    Spouse name: Not on file  . Number of children: Not on file  . Years of education: Not on file  . Highest education level: Not on file  Occupational History  . Not on file  Tobacco Use  . Smoking status: Never Smoker  . Smokeless tobacco: Never Used  Vaping Use  . Vaping Use: Never used  Substance and Sexual Activity  . Alcohol use: No  . Drug use: No  . Sexual activity: Not on file  Other Topics Concern  . Not on file  Social History Narrative  . Not on file   Social Determinants of Health   Financial Resource Strain: Not on file  Food Insecurity: Not on file  Transportation Needs: Not on file  Physical Activity: Not on file  Stress: Not on file  Social Connections: Not on file  Intimate Partner Violence: Not on file    Vital Signs: Last menstrual period 02/01/2021.  Examination: General Appearance: The patient is well-developed, well-nourished, and in no distress. Neck Circumference: *** Skin: Gross inspection of  skin unremarkable. Head: normocephalic, no gross deformities. Eyes: no gross deformities noted. ENT: ears appear grossly normal Neurologic: Alert and oriented. No involuntary movements.    EPWORTH SLEEPINESS SCALE:  Scale:  (0)= no chance of dozing; (1)= slight chance of dozing; (2)= moderate chance of dozing; (3)= high chance of dozing  Chance  Situtation    Sitting and reading: ***    Watching TV: ***    Sitting Inactive in public: ***    As a passenger in car: ***       Lying down to rest: ***    Sitting and talking: ***    Sitting quielty after lunch: ***    In a car, stopped in traffic: ***   TOTAL SCORE:   *** out of 24    SLEEP STUDIES:  1. ***   CPAP COMPLIANCE DATA:  Date Range: 01/14/21 - 02/12/21  Average Daily Use: 1:38 hours  Median Use: 3:30 hours   Compliance for > 4 Hours: 13%  AHI: 0.3 respiratory events per hour  Days Used: 12/30 days  Mask Leak: 33.1 lpm  95th Percentile Pressure: 13 cmh2o         LABS: Recent Results (from the past 2160 hour(s))  Urinalysis, Complete w Microscopic     Status: Abnormal   Collection Time: 12/07/20  7:17 PM  Result Value Ref Range   Color, Urine YELLOW (A) YELLOW   APPearance CLOUDY (A) CLEAR   Specific Gravity, Urine 1.026 1.005 - 1.030   pH 5.0 5.0 - 8.0   Glucose, UA NEGATIVE NEGATIVE mg/dL   Hgb urine dipstick MODERATE (A) NEGATIVE   Bilirubin Urine NEGATIVE NEGATIVE   Ketones, ur NEGATIVE NEGATIVE mg/dL   Protein, ur NEGATIVE NEGATIVE mg/dL   Nitrite NEGATIVE NEGATIVE   Leukocytes,Ua SMALL (A) NEGATIVE   RBC / HPF 0-5 0 - 5 RBC/hpf   WBC, UA 6-10 0 - 5 WBC/hpf   Bacteria, UA FEW (A) NONE SEEN   Squamous Epithelial / LPF 11-20 0 - 5   Mucus PRESENT     Comment: Performed at [facility], [address] [place], [place] [address]  Lipase, blood     Status: None   Collection Time: 12/07/20  7:43 PM  Result Value Ref Range   Lipase 24 11 - 51 U/L    Comment: Performed at [facility], [address] [place], [place] [address]  Comprehensive metabolic panel     Status: None   Collection Time: 12/07/20  7:43 PM  Result Value Ref Range   Sodium 137 135 - 145 mmol/L   Potassium 4.0 3.5 - 5.1 mmol/L   Chloride 103 98 - 111 mmol/L   CO2 27 22 - 32 mmol/L   Glucose, Bld 88 70 - 99 mg/dL    Comment: Glucose reference range applies only to samples taken after fasting for at least 8 hours.   BUN 13 6 - 20 mg/dL   Creatinine, Ser 8.290.93 0.44 -  1.00 mg/dL   Calcium 8.9 8.9 - 56.210.3 mg/dL   Total Protein 7.7 6.5 - 8.1 g/dL   Albumin 4.0 3.5 - 5.0 g/dL   AST 17 15 - 41 U/L   ALT 18 0 - 44 U/L   Alkaline Phosphatase 106 38 - 126 U/L   Total Bilirubin 0.7 0.3 - 1.2 mg/dL   GFR, Estimated >13>60 >08>60 mL/min    Comment: (NOTE) Calculated using the CKD-EPI Creatinine Equation (2021)    Anion gap 7 5 -  15    Comment: Performed at [facility], [address] [place], [place] [address]  CBC     Status: Abnormal   Collection Time: 12/07/20  7:43 PM  Result Value Ref Range   WBC 6.0 4.0 - 10.5 K/uL   RBC 4.71 3.87 - 5.11 MIL/uL   Hemoglobin 11.6 (L) 12.0 - 15.0 g/dL   HCT 63.0 16.0 - 10.9 %   MCV 83.0 80.0 - 100.0 fL   MCH 24.6 (L) 26.0 - 34.0 pg   MCHC 29.7 (L) 30.0 - 36.0 g/dL   RDW 32.3 55.7 - 32.2 %   Platelets 302 150 - 400 K/uL   nRBC 0.0 0.0 - 0.2 %    Comment: Performed at [facility] [place], [address] [place], [place] [address]  POC urine preg, ED     Status: None   Collection Time: 12/07/20  8:24 PM  Result Value Ref Range   Preg Test, Ur NEGATIVE NEGATIVE    Comment:        THE SENSITIVITY OF THIS METHODOLOGY IS >24 mIU/mL   SARS CORONAVIRUS 2 (TAT 6-24 HRS) Nasopharyngeal Nasopharyngeal Swab     Status: None   Collection Time: 01/07/21  9:55 AM   Specimen: Nasopharyngeal Swab  Result Value Ref Range   SARS Coronavirus 2 NEGATIVE NEGATIVE    Comment: (NOTE) SARS-CoV-2 target nucleic acids are NOT DETECTED.  The SARS-CoV-2 RNA is generally detectable in upper and lower respiratory specimens during the acute phase of infection. Negative results do not preclude SARS-CoV-2 infection, do not rule out co-infections with other pathogens, and should not be used as the sole basis for treatment or other patient management decisions. Negative results must be combined with clinical observations, patient history, and epidemiological information. The expected result is Negative.  Fact Sheet for  Patients: HairSlick.no  Fact Sheet for Healthcare Providers: quierodirigir.com  This test is not yet approved or cleared by the [place] FDA and  has been authorized for detection and/or diagnosis of SARS-CoV-2 by FDA under an Emergency Use Authorization (EUA). This EUA will remain  in effect (meaning this test can be used) for the duration of the COVID-19 declaration under Se ction 564(b)(1) of the Act, 21 U.S.C. section 360bbb-3(b)(1), unless the authorization is terminated or revoked sooner.  Performed at [facility], [address] [place], [place] [address]   Pregnancy, urine POC     Status: None   Collection Time: 01/11/21 11:57 AM  Result Value Ref Range   Preg Test, Ur NEGATIVE NEGATIVE    Comment:        THE SENSITIVITY OF THIS METHODOLOGY IS >24 mIU/mL   Surgical pathology     Status: None   Collection Time: 01/11/21 12:57 PM  Result Value Ref Range   SURGICAL PATHOLOGY      SURGICAL PATHOLOGY CASE: [id number] PATIENT: Verl Bangs Surgical Pathology Report     Specimen Submitted: A. Gallbladder  Clinical History: Robotic cholecystectomy      DIAGNOSIS: A. GALLBLADDER; CHOLECYSTECTOMY: - CHRONIC CHOLECYSTITIS AND CHOLELITHIASIS.   GROSS DESCRIPTION: A. Labeled: Gallbladder Received: Formalin Collection time: 2:31 PM on 01/11/2021 Placed into formalin time: 2:36 PM on 01/11/2021 Size of specimen: 8.2 x 3.6 x 2.7 cm Specimen integrity: Intact External surface: The serosa is tan-green, smooth, and glistening with a roughened hepatic bed. Wall thickness: Ranges from 0.2-0.3 cm Mucosa: The mucosa is tan-green and velvety with scattered areas of cholesterolosis.  There is 0.2 x 0.2 x  0.1 cm raised potential polyp and a 0.3 x 0.1 x 0.1 cm polypoid area of cholesterolosis. Cystic duct: The cystic duct is 2.5 x 0.8 x 0.2 cm.  The duct is partially obstructed by a small stone.  A  distinct adjacent lymph node candidate is not  grossly appreciated. Bile present: There is tan-green viscous bile. Stones present: There are 2 stones ranging from 0.4 to 2.5 cm in greatest dimension.  The stones are green-brown, lobulated, and firm. Other findings: None grossly appreciated.  Block summary: 1 - cystic duct resection margin, en face and inked blue, and representative wall with entire potential polyp and pedunculated cholesterolosis  RB 01/12/2021  Final Diagnosis performed by Redmond Pulling, MD.   Electronically signed 01/13/2021 10:37:46AM The electronic signature indicates that the named Attending Pathologist has evaluated the specimen Technical component performed at [facility], [address] [place], [place] [address] Lab: [phone] Dir: Jolene Schimke, MD, MMM  Professional component performed at [facility], [address] [place], [place], [place] [address] Lab: [phone] Dir: Georgiann Cocker. Oneita Kras, MD     Radiology: No results found.  No results found.  No results found.    Assessment and Plan: Patient Active Problem List   Diagnosis Date Noted  . Status post laparoscopic cholecystectomy 01/26/2021  . Severe obesity (BMI >= 40) (HCC) 12/10/2020      The patient *** tolerate PAP and reports *** benefit from PAP use. The patient was reminded how to *** and advised to ***. The patient was also counselled on ***. The compliance is ***. The AHI is ***.   1. ***  General Counseling: I have discussed the findings of the evaluation and examination with Rendi.  I have also discussed any further diagnostic evaluation thatmay be needed or ordered today. Lottie verbalizes understanding of the findings of todays visit. We also reviewed her medications today and discussed drug interactions and side effects including but not limited excessive drowsiness and altered mental states. We also discussed that there is always a risk not just to  her but also people around her. she has been encouraged to call the office with any questions or concerns that should arise related to todays visit.  No orders of the defined types were placed in this encounter.       I have personally obtained a history, examined the patient, evaluated laboratory and imaging results, formulated the assessment and plan and placed orders.   Valentino Hue Sol Blazing, PhD, FAASM  Diplomate, American Board of Sleep Medicine    Yevonne Pax, MD [facility] Diplomate ABMS Pulmonary and Critical Care Medicine Sleep medicine

## 2021-02-15 NOTE — Patient Instructions (Signed)

## 2021-03-11 ENCOUNTER — Encounter: Payer: Self-pay | Admitting: *Deleted

## 2021-03-12 ENCOUNTER — Encounter
Admission: RE | Disposition: A | Discharge status: Home / Self Care | Payer: Self-pay | Source: Home / Self Care | Attending: Gastroenterology

## 2021-03-12 ENCOUNTER — Ambulatory Visit: Payer: Medicare Other | Admitting: Urgent Care

## 2021-03-12 ENCOUNTER — Encounter: Payer: Self-pay | Admitting: *Deleted

## 2021-03-12 ENCOUNTER — Ambulatory Visit
Admission: RE | Admit: 2021-03-12 | Discharge: 2021-03-12 | Disposition: A | Discharge status: Home / Self Care | Payer: Medicare Other | Attending: Gastroenterology | Admitting: Gastroenterology

## 2021-03-12 DIAGNOSIS — Z7951 Long term (current) use of inhaled steroids: Secondary | ICD-10-CM | POA: Diagnosis not present

## 2021-03-12 DIAGNOSIS — Z91013 Allergy to seafood: Secondary | ICD-10-CM | POA: Diagnosis not present

## 2021-03-12 DIAGNOSIS — Z9049 Acquired absence of other specified parts of digestive tract: Secondary | ICD-10-CM | POA: Diagnosis not present

## 2021-03-12 DIAGNOSIS — Z880 Allergy status to penicillin: Secondary | ICD-10-CM | POA: Insufficient documentation

## 2021-03-12 DIAGNOSIS — K529 Noninfective gastroenteritis and colitis, unspecified: Secondary | ICD-10-CM | POA: Diagnosis not present

## 2021-03-12 DIAGNOSIS — Z79899 Other long term (current) drug therapy: Secondary | ICD-10-CM | POA: Diagnosis not present

## 2021-03-12 DIAGNOSIS — Z87892 Personal history of anaphylaxis: Secondary | ICD-10-CM | POA: Insufficient documentation

## 2021-03-12 DIAGNOSIS — K2970 Gastritis, unspecified, without bleeding: Secondary | ICD-10-CM | POA: Insufficient documentation

## 2021-03-12 DIAGNOSIS — Z6841 Body Mass Index (BMI) 40.0 and over, adult: Secondary | ICD-10-CM | POA: Diagnosis not present

## 2021-03-12 HISTORY — PX: ESOPHAGOGASTRODUODENOSCOPY (EGD) WITH PROPOFOL: SHX5813

## 2021-03-12 HISTORY — PX: COLONOSCOPY WITH PROPOFOL: SHX5780

## 2021-03-12 HISTORY — DX: Sleep apnea, unspecified: G47.30

## 2021-03-12 HISTORY — DX: Anxiety disorder, unspecified: F41.9

## 2021-03-12 HISTORY — DX: Dyspnea, unspecified: R06.00

## 2021-03-12 LAB — POCT PREGNANCY, URINE: Preg Test, Ur: NEGATIVE

## 2021-03-12 SURGERY — ESOPHAGOGASTRODUODENOSCOPY (EGD) WITH PROPOFOL
Anesthesia: General

## 2021-03-12 MED ORDER — PROPOFOL 10 MG/ML IV BOLUS
INTRAVENOUS | Status: DC | PRN
  Administered 2021-03-12: 10 mg via INTRAVENOUS
  Administered 2021-03-12 (×2): 20 mg via INTRAVENOUS

## 2021-03-12 MED ORDER — LIDOCAINE HCL (CARDIAC) PF 100 MG/5ML IV SOSY
PREFILLED_SYRINGE | INTRAVENOUS | Status: DC | PRN
  Administered 2021-03-12: 50 mg via INTRAVENOUS

## 2021-03-12 MED ORDER — SODIUM CHLORIDE 0.9 % IV SOLN: INTRAVENOUS | Status: DC

## 2021-03-12 MED ORDER — MIDAZOLAM HCL 2 MG/2ML IJ SOLN
INTRAMUSCULAR | Status: DC | PRN
  Administered 2021-03-12: 2 mg via INTRAVENOUS

## 2021-03-12 MED ORDER — KETOROLAC TROMETHAMINE 30 MG/ML IJ SOLN
INTRAMUSCULAR | Status: AC
  Filled 2021-03-12: qty 1

## 2021-03-12 MED ORDER — LIDOCAINE HCL (PF) 2 % IJ SOLN
INTRAMUSCULAR | Status: AC
  Filled 2021-03-12: qty 5

## 2021-03-12 MED ORDER — DEXMEDETOMIDINE HCL 200 MCG/2ML IV SOLN
INTRAVENOUS | Status: DC | PRN
Start: 2021-03-12 — End: 2021-03-12
  Administered 2021-03-12: 8 ug via INTRAVENOUS
  Administered 2021-03-12: 12 ug via INTRAVENOUS

## 2021-03-12 MED ORDER — MIDAZOLAM HCL 2 MG/2ML IJ SOLN
INTRAMUSCULAR | Status: AC
  Filled 2021-03-12: qty 2

## 2021-03-12 MED ORDER — DEXMEDETOMIDINE (PRECEDEX) IN NS 20 MCG/5ML (4 MCG/ML) IV SYRINGE
PREFILLED_SYRINGE | INTRAVENOUS | Status: AC
  Filled 2021-03-12: qty 5

## 2021-03-12 MED ORDER — FENTANYL CITRATE (PF) 100 MCG/2ML IJ SOLN
INTRAMUSCULAR | Status: AC
  Filled 2021-03-12: qty 2

## 2021-03-12 MED ORDER — PROPOFOL 500 MG/50ML IV EMUL
INTRAVENOUS | Status: DC | PRN
  Administered 2021-03-12: 70 ug/kg/min via INTRAVENOUS

## 2021-03-12 MED ORDER — FENTANYL CITRATE (PF) 100 MCG/2ML IJ SOLN
INTRAMUSCULAR | Status: DC | PRN
  Administered 2021-03-12 (×4): 25 ug via INTRAVENOUS

## 2021-03-12 NOTE — H&P (Signed)
Outpatient short stay form Pre-procedure 03/12/2021 8:07 AM Merlyn Lot MD, MPH  Primary Physician: [facility]  Reason for visit:  Nausea/diarrhea  History of present illness:   35 y/o lady with morbid obesity here for EGD/Colonoscopy due to nausea/diarrhea. Had her gallbladder removed recently. No blood thinners. No known family history of GI issues. Diarrhea worse after cholecystectomy.    Current Facility-Administered Medications:    0.9 %  sodium chloride infusion, , Intravenous, Continuous, Jalea Bronaugh, Rossie Muskrat, MD  Medications Prior to Admission  Medication Sig Dispense Refill Last Dose   ARIPiprazole (ABILIFY) 10 MG tablet Take 1.5 tablets (15 mg total) by mouth daily.   03/11/2021   dicyclomine (BENTYL) 10 MG capsule Take 10 mg by mouth 4 (four) times daily -  before meals and at bedtime.      FLOVENT HFA 110 MCG/ACT inhaler Inhale 2 puffs into the lungs 2 (two) times daily.   03/12/2021   fluticasone-salmeterol (ADVAIR DISKUS) 250-50 MCG/ACT AEPB Inhale 1 puff into the lungs in the morning and at bedtime.      HYDROcodone-acetaminophen (NORCO/VICODIN) 5-325 MG tablet Take 1 tablet by mouth every 6 (six) hours as needed for moderate pain.      lamoTRIgine (LAMICTAL) 25 MG tablet Take 25 mg by mouth daily.      ondansetron (ZOFRAN) 4 MG tablet Take 4 mg by mouth every 8 (eight) hours as needed for nausea or vomiting.      albuterol (PROVENTIL HFA;VENTOLIN HFA) 108 (90 Base) MCG/ACT inhaler Inhale 2 puffs into the lungs every 4 (four) hours as needed for wheezing or shortness of breath.      albuterol (PROVENTIL) (2.5 MG/3ML) 0.083% nebulizer solution Take 2.5 mg by nebulization every 4 (four) hours as needed.      Cholecalciferol (VITAMIN D3) 50 MCG (2000 UT) TABS Take 2,000 Units by mouth daily.      ibuprofen (ADVIL) 800 MG tablet Take 1 tablet (800 mg total) by mouth every 8 (eight) hours as needed. 30 tablet 0    montelukast (SINGULAIR) 10 MG tablet Take 1  tablet by mouth daily.   03/10/2021   Spacer/Aero Chamber Mouthpiece MISC 1 Units by Does not apply route every 4 (four) hours as needed (wheezing). 1 each 0    vitamin B-12 (CYANOCOBALAMIN) 500 MCG tablet Take 500 mcg by mouth daily.        Allergies  Allergen Reactions   Shellfish Allergy Anaphylaxis   Penicillins Itching     Past Medical History:  Diagnosis Date   Anxiety    Asthma    Depression    Dyspnea    Sleep apnea     Review of systems:  Otherwise negative.    Physical Exam  Gen: Alert, oriented. Appears stated age.  HEENT: PERRLA. Lungs: No respiratory distress CV: RRR Abd: soft, benign, no masses Ext: No edema    Planned procedures: Proceed with EGD/colonoscopy. The patient understands the nature of the planned procedure, indications, risks, alternatives and potential complications including but not limited to bleeding, infection, perforation, damage to internal organs and possible oversedation/side effects from anesthesia. The patient agrees and gives consent to proceed.  Please refer to procedure notes for findings, recommendations and patient disposition/instructions.     Merlyn Lot MD, MPH Gastroenterology 03/12/2021  8:07 AM

## 2021-03-12 NOTE — Interval H&P Note (Signed)
History and Physical Interval Note:  03/12/2021 8:11 AM  [name]  has presented today for surgery, with the diagnosis of Diarrhea Nausea Abd Pain.  The various methods of treatment have been discussed with the patient and family. After consideration of risks, benefits and other options for treatment, the patient has consented to  Procedure(s): ESOPHAGOGASTRODUODENOSCOPY (EGD) WITH PROPOFOL (N/A) COLONOSCOPY WITH PROPOFOL (N/A) as a surgical intervention.  The patient's history has been reviewed, patient examined, no change in status, stable for surgery.  I have reviewed the patient's chart and labs.  Questions were answered to the patient's satisfaction.     [name]  Ok to proceed with EGD/Colonoscopy

## 2021-03-12 NOTE — Anesthesia Preprocedure Evaluation (Signed)
Anesthesia Evaluation  Patient identified by MRN, date of birth, ID band Patient awake    Reviewed: Allergy & Precautions, NPO status , Patient's Chart, lab work & pertinent test results  History of Anesthesia Complications Negative for: history of anesthetic complications  Airway Mallampati: III       Dental   Pulmonary asthma , sleep apnea and Continuous Positive Airway Pressure Ventilation , Not current smoker,           Cardiovascular (-) hypertension(-) Past MI and (-) CHF (-) dysrhythmias (-) Valvular Problems/Murmurs     Neuro/Psych neg Seizures Anxiety Depression    GI/Hepatic Neg liver ROS, neg GERD  ,  Endo/Other  neg diabetesMorbid obesity  Renal/GU negative Renal ROS     Musculoskeletal   Abdominal   Peds  Hematology   Anesthesia Other Findings   Reproductive/Obstetrics                             Anesthesia Physical Anesthesia Plan  ASA: 3  Anesthesia Plan: General   Post-op Pain Management:    Induction: Intravenous  PONV Risk Score and Plan: 3 and Propofol infusion and TIVA  Airway Management Planned: Nasal Cannula  Additional Equipment:   Intra-op Plan:   Post-operative Plan:   Informed Consent: I have reviewed the patients History and Physical, chart, labs and discussed the procedure including the risks, benefits and alternatives for the proposed anesthesia with the patient or authorized representative who has indicated his/her understanding and acceptance.       Plan Discussed with:   Anesthesia Plan Comments:         Anesthesia Quick Evaluation

## 2021-03-12 NOTE — Anesthesia Postprocedure Evaluation (Signed)
Anesthesia Post Note  Patient: Erica Webster  Procedure(s) Performed: ESOPHAGOGASTRODUODENOSCOPY (EGD) WITH PROPOFOL COLONOSCOPY WITH PROPOFOL  Patient location during evaluation: Endoscopy Anesthesia Type: General Level of consciousness: awake and alert Pain management: pain level controlled Vital Signs Assessment: post-procedure vital signs reviewed and stable Respiratory status: spontaneous breathing and respiratory function stable Cardiovascular status: stable Anesthetic complications: no   No notable events documented.   Last Vitals:  Vitals:   03/12/21 0854 03/12/21 0855  BP: (!) 88/62 (!) 115/91  Pulse:    Resp:    Temp:      Last Pain:  Vitals:   03/12/21 0854  TempSrc:   PainSc: 0-No pain                 [name]

## 2021-03-12 NOTE — Op Note (Signed)
[facility] Gastroenterology Patient Name: Erica Webster Procedure Date: 03/12/2021 8:16 AM MRN: [id number] Account #: [id number] Date of Birth: 1985-10-28 Admit Type: Outpatient Age: 35 Room: [facility] ENDO ROOM 3 Gender: Female Note Status: Finalized Procedure:             Colonoscopy Indications:           Chronic diarrhea Providers:             Andrey Farmer MD, MD Referring MD:          [facility] (Referring MD) Medicines:             Monitored Anesthesia Care Complications:         No immediate complications. Estimated blood loss:                         Minimal. Procedure:             Pre-Anesthesia Assessment:                        - Prior to the procedure, a History and Physical was                         performed, and patient medications and allergies were                         reviewed. The patient is competent. The risks and                         benefits of the procedure and the sedation options and                         risks were discussed with the patient. All questions                         were answered and informed consent was obtained.                         Patient identification and proposed procedure were                         verified by the physician, the nurse, the anesthetist                         and the technician in the endoscopy suite. Mental                         Status Examination: alert and oriented. Airway                         Examination: normal oropharyngeal airway and neck                         mobility. Respiratory Examination: clear to                         auscultation. CV Examination: normal. Prophylactic                         Antibiotics: The  patient does not require prophylactic                         antibiotics. Prior Anticoagulants: The patient has                         taken no previous anticoagulant or antiplatelet                         agents. ASA Grade  Assessment: III - A patient with                         severe systemic disease. After reviewing the risks and                         benefits, the patient was deemed in satisfactory                         condition to undergo the procedure. The anesthesia                         plan was to use monitored anesthesia care (MAC).                         Immediately prior to administration of medications,                         the patient was re-assessed for adequacy to receive                         sedatives. The heart rate, respiratory rate, oxygen                         saturations, blood pressure, adequacy of pulmonary                         ventilation, and response to care were monitored                         throughout the procedure. The physical status of the                         patient was re-assessed after the procedure.                        After obtaining informed consent, the colonoscope was                         passed under direct vision. Throughout the procedure,                         the patient's blood pressure, pulse, and oxygen                         saturations were monitored continuously. The                         Colonoscope was introduced through the anus and  advanced to the the terminal ileum. The colonoscopy                         was performed without difficulty. The patient                         tolerated the procedure well. The quality of the bowel                         preparation was good. Findings:      The perianal and digital rectal examinations were normal.      The terminal ileum appeared normal.      The colon (entire examined portion) appeared normal. Biopsies for       histology were taken with a cold forceps from the entire colon for       evaluation of microscopic colitis. Estimated blood loss was minimal.      The exam was otherwise without abnormality on direct and retroflexion        views. Impression:            - The examined portion of the ileum was normal.                        - The entire examined colon is normal. Biopsied.                        - The examination was otherwise normal on direct and                         retroflexion views. Recommendation:        - Discharge patient to home.                        - Resume previous diet.                        - Continue present medications.                        - Await pathology results.                        - Repeat colonoscopy in 10 years for screening                         purposes.                        - Return to referring physician as previously                         scheduled. Procedure Code(s):     --- Professional ---                        [phone], Colonoscopy, flexible; with biopsy, single or                         multiple Diagnosis Code(s):     --- Professional ---  K52.9, Noninfective gastroenteritis and colitis,                         unspecified CPT copyright 2019 American Medical Association. All rights reserved. The codes documented in this report are preliminary and upon coder review may  be revised to meet current compliance requirements. Andrey Farmer MD, MD 03/12/2021 8:53:36 AM Number of Addenda: 0 Note Initiated On: 03/12/2021 8:16 AM Scope Withdrawal Time: 0 hours 6 minutes 35 seconds  Total Procedure Duration: 0 hours 9 minutes 22 seconds  Estimated Blood Loss:  Estimated blood loss was minimal.      [facility]

## 2021-03-12 NOTE — Op Note (Signed)
[facility] Gastroenterology Patient Name: Erica Webster Procedure Date: 03/12/2021 8:17 AM MRN: [id number] Account #: [id number] Date of Birth: December 20, 1985 Admit Type: Outpatient Age: 35 Room: [facility] ENDO ROOM 3 Gender: Female Note Status: Finalized Procedure:             Upper GI endoscopy Indications:           Nausea Providers:             Andrey Farmer MD, MD Referring MD:          [facility] (Referring MD) Medicines:             Monitored Anesthesia Care Complications:         No immediate complications. Estimated blood loss:                         Minimal. Procedure:             Pre-Anesthesia Assessment:                        - Prior to the procedure, a History and Physical was                         performed, and patient medications and allergies were                         reviewed. The patient is competent. The risks and                         benefits of the procedure and the sedation options and                         risks were discussed with the patient. All questions                         were answered and informed consent was obtained.                         Patient identification and proposed procedure were                         verified by the physician, the nurse, the anesthetist                         and the technician in the endoscopy suite. Mental                         Status Examination: alert and oriented. Airway                         Examination: normal oropharyngeal airway and neck                         mobility. Respiratory Examination: clear to                         auscultation. CV Examination: normal. Prophylactic                         Antibiotics:  The patient does not require prophylactic                         antibiotics. Prior Anticoagulants: The patient has                         taken no previous anticoagulant or antiplatelet                         agents. ASA Grade  Assessment: III - A patient with                         severe systemic disease. After reviewing the risks and                         benefits, the patient was deemed in satisfactory                         condition to undergo the procedure. The anesthesia                         plan was to use monitored anesthesia care (MAC).                         Immediately prior to administration of medications,                         the patient was re-assessed for adequacy to receive                         sedatives. The heart rate, respiratory rate, oxygen                         saturations, blood pressure, adequacy of pulmonary                         ventilation, and response to care were monitored                         throughout the procedure. The physical status of the                         patient was re-assessed after the procedure.                        After obtaining informed consent, the endoscope was                         passed under direct vision. Throughout the procedure,                         the patient's blood pressure, pulse, and oxygen                         saturations were monitored continuously. The Endoscope                         was introduced through the mouth, and advanced to the  second part of duodenum. The upper GI endoscopy was                         somewhat difficult due to the patient's body habitus.                         The patient tolerated the procedure well. Findings:      The examined esophagus was normal.      The entire examined stomach was normal. Biopsies were taken with a cold       forceps for histology. Estimated blood loss was minimal.      The examined duodenum was normal. Impression:            - Normal esophagus.                        - Normal stomach. Biopsied.                        - Normal examined duodenum. Recommendation:        - Perform a colonoscopy today. Procedure Code(s):     ---  Professional ---                        [phone], Esophagogastroduodenoscopy, flexible,                         transoral; with biopsy, single or multiple Diagnosis Code(s):     --- Professional ---                        R11.0, Nausea CPT copyright 2019 American Medical Association. All rights reserved. The codes documented in this report are preliminary and upon coder review may  be revised to meet current compliance requirements. Andrey Farmer MD, MD 03/12/2021 8:48:44 AM Number of Addenda: 0 Note Initiated On: 03/12/2021 8:17 AM Estimated Blood Loss:  Estimated blood loss was minimal.      [facility]

## 2021-03-12 NOTE — Transfer of Care (Signed)
Immediate Anesthesia Transfer of Care Note  Patient: Erica Webster  Procedure(s) Performed: ESOPHAGOGASTRODUODENOSCOPY (EGD) WITH PROPOFOL COLONOSCOPY WITH PROPOFOL  Patient Location: PACU  Anesthesia Type:General  Level of Consciousness: sedated  Airway & Oxygen Therapy: Patient Spontanous Breathing and Patient connected to face mask oxygen  Post-op Assessment: Report given to RN and Post -op Vital signs reviewed and stable  Post vital signs: Reviewed and stable  Last Vitals:  Vitals Value Taken Time  BP 88/62 03/12/21 0849  Temp    Pulse 90 03/12/21 0850  Resp 23 03/12/21 0850  SpO2 100 % 03/12/21 0850  Vitals shown include unvalidated device data.  Last Pain:  Vitals:   03/12/21 0745  TempSrc: Temporal         Complications: No notable events documented.

## 2021-03-16 LAB — SURGICAL PATHOLOGY

## 2021-04-09 NOTE — Progress Notes (Signed)
[facility] [address] [place], [place] [address]  Pulmonary Sleep Medicine   Office Visit Note  Patient Name: Glenda Spelman DOB: 11/08/1985 MRN [id number]    Chief Complaint: Obstructive Sleep Apnea visit  Brief History:  Monna is seen today for follow up to increase patient compliance  since addressing mask discomfort.  The patient has a 6 month history of sleep apnea. Patient is using PAP nightly @ 13 cmH2O,   The patient reports feeling better, well rested from PAP use. Reported sleepiness is  improved and the Epworth Sleepiness Score is 9 out of 24. The patient most days for hour take naps. The patient complains of the following: dry mouth & feeling like can't breath so takes it off.  The compliance download shows  compliance with an average use time of 4:02 hours @ 47% (30% increaswe from 2 months ago.). The AHI is 0.2  The patient does not complain of limb movements disrupting sleep.  ROS  General: (-) fever, (-) chills, (-) night sweat Nose and Sinuses: (-) nasal stuffiness or itchiness, (-) postnasal drip, (-) nosebleeds, (-) sinus trouble. Mouth and Throat: (-) sore throat, (-) hoarseness. Neck: (-) swollen glands, (-) enlarged thyroid, (-) neck pain. Respiratory: - cough, - shortness of breath, - wheezing. Neurologic: - numbness, - tingling. Psychiatric: + anxiety, + depression   Current Medication: Outpatient Encounter Medications as of 04/12/2021  Medication Sig   albuterol (PROVENTIL HFA;VENTOLIN HFA) 108 (90 Base) MCG/ACT inhaler Inhale 2 puffs into the lungs every 4 (four) hours as needed for wheezing or shortness of breath.   albuterol (PROVENTIL) (2.5 MG/3ML) 0.083% nebulizer solution Take 2.5 mg by nebulization every 4 (four) hours as needed.   ARIPiprazole (ABILIFY) 10 MG tablet Take 1.5 tablets (15 mg total) by mouth daily.   Cholecalciferol (VITAMIN D3) 50 MCG (2000 UT) TABS Take 2,000 Units by mouth daily.   dicyclomine (BENTYL) 10 MG  capsule Take 10 mg by mouth 4 (four) times daily -  before meals and at bedtime.   FLOVENT HFA 110 MCG/ACT inhaler Inhale 2 puffs into the lungs 2 (two) times daily.   fluticasone-salmeterol (ADVAIR DISKUS) 250-50 MCG/ACT AEPB Inhale 1 puff into the lungs in the morning and at bedtime.   HYDROcodone-acetaminophen (NORCO/VICODIN) 5-325 MG tablet Take 1 tablet by mouth every 6 (six) hours as needed for moderate pain.   ibuprofen (ADVIL) 800 MG tablet Take 1 tablet (800 mg total) by mouth every 8 (eight) hours as needed.   lamoTRIgine (LAMICTAL) 150 MG tablet Take 150 mg by mouth daily.   lamoTRIgine (LAMICTAL) 25 MG tablet Take 25 mg by mouth daily.   montelukast (SINGULAIR) 10 MG tablet Take 1 tablet by mouth daily.   ondansetron (ZOFRAN) 4 MG tablet Take 4 mg by mouth every 8 (eight) hours as needed for nausea or vomiting.   Spacer/Aero Chamber Mouthpiece MISC 1 Units by Does not apply route every 4 (four) hours as needed (wheezing).   vitamin B-12 (CYANOCOBALAMIN) 500 MCG tablet Take 500 mcg by mouth daily.   No facility-administered encounter medications on file as of 04/12/2021.    Surgical History: Past Surgical History:  Procedure Laterality Date   CHOLECYSTECTOMY     COLONOSCOPY WITH PROPOFOL N/A 03/12/2021   Procedure: COLONOSCOPY WITH PROPOFOL;  Surgeon: [name], MD;  Location: [facility] ENDOSCOPY;  Service: Endoscopy;  Laterality: N/A;   ESOPHAGOGASTRODUODENOSCOPY (EGD) WITH PROPOFOL N/A 03/12/2021   Procedure: ESOPHAGOGASTRODUODENOSCOPY (EGD) WITH PROPOFOL;  Surgeon: [name], MD;  Location: [facility] ENDOSCOPY;  Service: Endoscopy;  Laterality: N/A;   TOENAIL EXCISION      Medical History: Past Medical History:  Diagnosis Date   Anxiety    Asthma    Depression    Dyspnea    Sleep apnea     Family History: Non contributory to the present illness  Social History: Social History   Socioeconomic History   Marital status: Single    Spouse name: Not on file    Number of children: Not on file   Years of education: Not on file   Highest education level: Not on file  Occupational History   Not on file  Tobacco Use   Smoking status: Never   Smokeless tobacco: Never  Vaping Use   Vaping Use: Never used  Substance and Sexual Activity   Alcohol use: No   Drug use: No   Sexual activity: Not on file  Other Topics Concern   Not on file  Social History Narrative   Not on file   Social Determinants of Health   Financial Resource Strain: Not on file  Food Insecurity: Not on file  Transportation Needs: Not on file  Physical Activity: Not on file  Stress: Not on file  Social Connections: Not on file  Intimate Partner Violence: Not on file    Vital Signs: Blood pressure (!) 147/91, pulse (!) 110, resp. rate 16, height 5\' 2"  (1.575 m), weight (!) 325 lb (147.4 kg), SpO2 98 %.  Examination: General Appearance: The patient is well-developed, well-nourished, and in no distress. Neck Circumference: 38 Skin: Gross inspection of skin unremarkable. Head: normocephalic, no gross deformities. Eyes: no gross deformities noted. ENT: ears appear grossly normal Neurologic: Alert and oriented. No involuntary movements.    EPWORTH SLEEPINESS SCALE:  Scale:  (0)= no chance of dozing; (1)= slight chance of dozing; (2)= moderate chance of dozing; (3)= high chance of dozing  Chance  Situtation    Sitting and reading: 0    Watching TV: 0    Sitting Inactive in public: 2    As a passenger in car: 3      Lying down to rest: 3    Sitting and talking: 0    Sitting quielty after lunch: 1    In a car, stopped in traffic: 0   TOTAL SCORE:   9 out of 24    SLEEP STUDIES:  Split 10/01/2020  -  AHI 49.8 (REM AHI 102.9),  Min SpO2 62%   CPAP COMPLIANCE DATA:  Date Range: 03/09/21 - 04/07/21  Average Daily Use: 4:02 hours  Median Use: 4:11  Compliance for > 4 Hours: 47%  AHI: 0.2 respiratory events per hour  Days Used: 27/30  days  Mask Leak: 9.6 lpm  95th Percentile Pressure: 13 cmH2O         LABS: Recent Results (from the past 2160 hour(s))  Pregnancy, urine POC     Status: None   Collection Time: 03/12/21  7:48 AM  Result Value Ref Range   Preg Test, Ur NEGATIVE NEGATIVE    Comment:        THE SENSITIVITY OF THIS METHODOLOGY IS >24 mIU/mL   Surgical pathology     Status: None   Collection Time: 03/12/21  8:27 AM  Result Value Ref Range   SURGICAL PATHOLOGY      SURGICAL PATHOLOGY CASE: [phone] PATIENT: [id number] Surgical Pathology Report     Specimen Submitted: A. Stomach; cbx B. Colon, random; cbx  Clinical  History: Diarrhea, nausea, ABD pain.  Normal endoscopy      DIAGNOSIS: A. STOMACH; COLD BIOPSY: - ANTRAL MUCOSA WITH MILD REACTIVE GASTRITIS. - NEGATIVE FOR H. PYLORI, INTESTINAL METAPLASIA, DYSPLASIA, AND MALIGNANCY.  B.  COLON, RANDOM; COLD BIOPSY: - COLONIC MUCOSA WITH FOCAL LYMPHOID AGGREGATES, NON-SPECIFIC. - NEGATIVE FOR MICROSCOPIC COLITIS, DYSPLASIA, AND MALIGNANCY.  GROSS DESCRIPTION: A. Labeled: Gastric cbx rule out H. pylori Received: Formalin Collection time: 8:27 AM on 03/12/2021 Placed into formalin time: 8:27 AM on 03/12/2021 Tissue fragment(s): 3 Size: Aggregate, 1 x 0.5 x 0.1 cm Description: Tan soft tissue fragments Entirely submitted in 1 cassette.  B. Labeled: Random colon cbx rule out microscopic colitis Received: Formalin Collection time: 8:39 AM on 03/12/2021 Placed  into formalin time: 8:39 AM on 03/12/2021 Tissue fragment(s): Multiple Size: Aggregate, 1.5 x 0.8 x 0.1 cm Description: Tan soft tissue fragments Entirely submitted in 1 cassette.  RB 03/12/2021  Final Diagnosis performed by Elijah Birk, MD.   Electronically signed 03/16/2021 12:59:21PM The electronic signature indicates that the named Attending Pathologist has evaluated the specimen Technical component performed at [facility], [address], [place], [place]  [address] Lab: [phone] Dir: Jolene Schimke, MD, MMM  Professional component performed at [facility], [address] [place], [place], [place] [address] Lab: [phone] Dir: Georgiann Cocker. Oneita Kras, MD     Radiology: No results found.  No results found.  No results found.    Assessment and Plan: Patient Active Problem List   Diagnosis Date Noted   OSA on CPAP 02/15/2021   CPAP use counseling 02/15/2021   Morbid obesity (HCC) 02/15/2021   Status post laparoscopic cholecystectomy 01/26/2021   Severe obesity (BMI >= 40) (HCC) 12/10/2020   1. OSA on CPAP The patient does tolerate PAP and reports definite benefit from PAP use. The patient was reminded how to clean equipment and advised to replace supplies routinely. The patient was also counselled on weight loss. She is complaining of dry  mouth limiting her use, humidity will be adjusted. . The compliance is poor but substantially improved from prior. AHI is 0.2 .    OSA- continue to work on compliance, commended on her improvement to date.    2. CPAP use counseling CPAP Counseling: had a lengthy discussion with the patient regarding the importance of PAP therapy in management of the sleep apnea. Patient appears to understand the risk factor reduction and also understands the risks associated with untreated sleep apnea. Patient will try to make a good faith effort to remain compliant with therapy. Also instructed the patient on proper cleaning of the device including the water must be changed daily if possible and use of distilled water is preferred. Patient understands that the machine should be regularly cleaned with appropriate recommended cleaning solutions that do not damage the PAP machine for example given white vinegar and water rinses. Other methods such as ozone treatment may not be as good as these simple methods to achieve cleaning.   3. Morbid obesity (HCC) Obesity Counseling: Had a lengthy discussion  regarding patients BMI and weight issues. Patient was instructed on portion control as well as increased activity. Also discussed caloric restrictions with trying to maintain intake less than 2000 Kcal. Discussions were made in accordance with the 5As of weight management. Simple actions such as not eating late and if able to, taking a walk is suggested.     General Counseling: I have discussed the findings of the evaluation and examination with Demitria.  I have also  discussed any further diagnostic evaluation thatmay be needed or ordered today. Averyana verbalizes understanding of the findings of todays visit. We also reviewed her medications today and discussed drug interactions and side effects including but not limited excessive drowsiness and altered mental states. We also discussed that there is always a risk not just to her but also people around her. she has been encouraged to call the office with any questions or concerns that should arise related to todays visit.  No orders of the defined types were placed in this encounter.       I have personally obtained a history, examined the patient, evaluated laboratory and imaging results, formulated the assessment and plan and placed orders.   This patient was seen today by Emmaline KluverSarah Terrell, PA-C in collaboration with Dr. Freda MunroSaadat Collan Schoenfeld.  Yevonne PaxSaadat A Khoen Genet, MD [facility] Diplomate ABMS Pulmonary and Critical Care Medicine Sleep medicine

## 2021-04-12 ENCOUNTER — Ambulatory Visit (INDEPENDENT_AMBULATORY_CARE_PROVIDER_SITE_OTHER): Payer: Medicare Other | Admitting: Internal Medicine

## 2021-04-12 VITALS — BP 147/91 | HR 110 | Resp 16 | Ht 62.0 in | Wt 325.0 lb

## 2021-04-12 DIAGNOSIS — Z7189 Other specified counseling: Secondary | ICD-10-CM | POA: Diagnosis not present

## 2021-04-12 DIAGNOSIS — Z9989 Dependence on other enabling machines and devices: Secondary | ICD-10-CM

## 2021-04-12 DIAGNOSIS — G4733 Obstructive sleep apnea (adult) (pediatric): Secondary | ICD-10-CM

## 2021-04-12 NOTE — Patient Instructions (Signed)

## 2021-06-11 NOTE — Progress Notes (Signed)
Sleep Medicine   Office Visit  Patient Name: Erica Webster DOB: 1986-03-18 MRN [id number]    Chief Complaint: Medicare restart for PAP therapy   HISTORY OF PRESENT ILLNESS: Erica Webster is seen today Patient here for Medicare restart consult to restart PAP therapy which terminated due to noncompliance approximately 2 months ago.   Patient reports bedtime as 8 - 830 pm and wakeup 530 am; wakes 2 times nightly for bathroom.  C/o feeling weak and or jittery sometimes, randomly.  The patient has been taking Abilify  at the following dose  10 mg once daily at night, still makes her drowsy. The patient reports symptoms have daytime sleepiness when she takes the Abilify  since starting medication. Sleep quality is poor since starting medication and the patient does have daytime sleepiness. The Epworth Sleepiness Score is 10.   ROS  General: (-) fever, (-) chills, (-) night sweat Nose and Sinuses: (-) nasal stuffiness or itchiness, (-) postnasal drip, (-) nosebleeds, (-) sinus trouble. Mouth and Throat: (-) sore throat, (-) hoarseness. Neck: (-) swollen glands, (-) enlarged thyroid, (-) neck pain. Respiratory: - cough, - shortness of breath, - wheezing. Neurologic: - numbness, - tingling. Psychiatric: + anxiety, + depression Sleep behavior: -sleep paralysis -hypnogogic hallucinations -dream enactment      -vivid dreams -cataplexy -night terrors -sleep walking   Current Medication: Outpatient Encounter Medications as of 06/14/2021  Medication Sig   albuterol (PROVENTIL HFA;VENTOLIN HFA) 108 (90 Base) MCG/ACT inhaler Inhale 2 puffs into the lungs every 4 (four) hours as needed for wheezing or shortness of breath.   albuterol (PROVENTIL) (2.5 MG/3ML) 0.083% nebulizer solution Take 2.5 mg by nebulization every 4 (four) hours as needed.   ARIPiprazole (ABILIFY) 10 MG tablet Take 1.5 tablets (15 mg total) by mouth daily.   Cholecalciferol (VITAMIN D3) 50 MCG (2000 UT) TABS Take 2,000 Units by mouth  daily.   dicyclomine (BENTYL) 10 MG capsule Take 10 mg by mouth 4 (four) times daily -  before meals and at bedtime.   FLOVENT HFA 110 MCG/ACT inhaler Inhale 2 puffs into the lungs 2 (two) times daily.   fluticasone-salmeterol (ADVAIR DISKUS) 250-50 MCG/ACT AEPB Inhale 1 puff into the lungs in the morning and at bedtime.   HYDROcodone-acetaminophen (NORCO/VICODIN) 5-325 MG tablet Take 1 tablet by mouth every 6 (six) hours as needed for moderate pain.   ibuprofen (ADVIL) 800 MG tablet Take 1 tablet (800 mg total) by mouth every 8 (eight) hours as needed.   lamoTRIgine (LAMICTAL) 150 MG tablet Take 150 mg by mouth daily.   lamoTRIgine (LAMICTAL) 25 MG tablet Take 25 mg by mouth daily.   montelukast (SINGULAIR) 10 MG tablet Take 1 tablet by mouth daily.   ondansetron (ZOFRAN) 4 MG tablet Take 4 mg by mouth every 8 (eight) hours as needed for nausea or vomiting.   Spacer/Aero Chamber Mouthpiece MISC 1 Units by Does not apply route every 4 (four) hours as needed (wheezing).   vitamin B-12 (CYANOCOBALAMIN) 500 MCG tablet Take 500 mcg by mouth daily.   No facility-administered encounter medications on file as of 06/14/2021.    Surgical History: Past Surgical History:  Procedure Laterality Date   CHOLECYSTECTOMY     COLONOSCOPY WITH PROPOFOL N/A 03/12/2021   Procedure: COLONOSCOPY WITH PROPOFOL;  Surgeon: [name], MD;  Location: [facility] ENDOSCOPY;  Service: Endoscopy;  Laterality: N/A;   ESOPHAGOGASTRODUODENOSCOPY (EGD) WITH PROPOFOL N/A 03/12/2021   Procedure: ESOPHAGOGASTRODUODENOSCOPY (EGD) WITH PROPOFOL;  Surgeon: [name], MD;  [facility]: [facility]  ENDOSCOPY;  Service: Endoscopy;  Laterality: N/A;   TOENAIL EXCISION      Medical History: Past Medical History:  Diagnosis Date   Anxiety    Asthma    Depression    Dyspnea    Sleep apnea     Family History: Non contributory to the present illness  Social History: Social History   Socioeconomic History   Marital status:  Single    Spouse name: Not on file   Number of children: Not on file   Years of education: Not on file   Highest education level: Not on file  Occupational History   Not on file  Tobacco Use   Smoking status: Never   Smokeless tobacco: Never  Vaping Use   Vaping Use: Never used  Substance and Sexual Activity   Alcohol use: No   Drug use: No   Sexual activity: Not on file  Other Topics Concern   Not on file  Social History Narrative   Not on file   Social Determinants of Health   Financial Resource Strain: Not on file  Food Insecurity: Not on file  Transportation Needs: Not on file  Physical Activity: Not on file  Stress: Not on file  Social Connections: Not on file  Intimate Partner Violence: Not on file    Vital Signs: There were no vitals taken for this visit.  Examination: General Appearance: The patient is well-developed, well-nourished, and in no distress. Neck Circumference: 38 cm Skin: Gross inspection of skin unremarkable. Head: normocephalic, no gross deformities. Eyes: no gross deformities noted. ENT: ears appear grossly normal Neurologic: Alert and oriented. No involuntary movements.    EPWORTH SLEEPINESS SCALE:  Scale:  (0)= no chance of dozing; (1)= slight chance of dozing; (2)= moderate chance of dozing; (3)= high chance of dozing  Chance  Situtation    Sitting and reading: 0    Watching TV: 1    Sitting Inactive in public: 2    As a passenger in car: 3      Lying down to rest: 3    Sitting and talking: 0    Sitting quielty after lunch: 1    In a car, stopped in traffic: 0   TOTAL SCORE:   10 out of 24    SLEEP STUDIES:  Split 10/01/2020  -  AHI 49.8 (REM AHI 102.9),  Min SpO2 62%   LABS: No results found for this or any previous visit (from the past 2160 hour(s)).  Radiology: No results found.  No results found.  No results found.    Assessment and Plan: Patient Active Problem List   Diagnosis Date Noted    OSA on CPAP 02/15/2021   CPAP use counseling 02/15/2021   Morbid obesity (HCC) 02/15/2021   Status post laparoscopic cholecystectomy 01/26/2021   Severe obesity (BMI >= 40) (HCC) 12/10/2020     PLAN OSA:   Patient evaluation suggests high risk of sleep disordered breathing due to h/o severe sleep apnea as noted above. She had not established compliance and had to turn the cpap in. She is interested in restarting. Patient has comorbid cardiovascular risk factors including: elevated blood pressure which could be exacerbated by pathologic sleep-disordered breathing.  Suggest: cpap titration  to assess the patient's sleep disordered breathing. The patient was also counselled on weight loss to optimize sleep health.   General Counseling: I have discussed the findings of the evaluation and examination with Erica Webster.  I have also discussed any further diagnostic evaluation thatmay  be needed or ordered today. Erica Webster verbalizes understanding of the findings of todays visit. We also reviewed her medications today and discussed drug interactions and side effects including but not limited excessive drowsiness and altered mental states. We also discussed that there is always a risk not just to her but also people around her. she has been encouraged to call the office with any questions or concerns that should arise related to todays visit.  No orders of the defined types were placed in this encounter.       I have personally obtained a history, evaluated the patient, evaluated pertinent data, formulated the assessment and plan and placed orders.   This patient was seen today by Emmaline Kluver, PA-C in collaboration with Dr. Freda Munro.     Yevonne Pax, MD [facility] Diplomate ABMS Pulmonary and Critical Care Medicine Sleep medicine

## 2021-06-14 ENCOUNTER — Ambulatory Visit (INDEPENDENT_AMBULATORY_CARE_PROVIDER_SITE_OTHER): Payer: Medicare Other | Admitting: Internal Medicine

## 2021-06-14 ENCOUNTER — Other Ambulatory Visit: Payer: Self-pay

## 2021-06-14 VITALS — BP 134/82 | HR 113 | Resp 18 | Ht 62.0 in | Wt 330.9 lb

## 2021-06-14 DIAGNOSIS — G4733 Obstructive sleep apnea (adult) (pediatric): Secondary | ICD-10-CM | POA: Diagnosis not present

## 2021-06-14 DIAGNOSIS — J4540 Moderate persistent asthma, uncomplicated: Secondary | ICD-10-CM | POA: Diagnosis not present

## 2021-06-14 NOTE — Patient Instructions (Signed)

## 2021-08-11 ENCOUNTER — Emergency Department: Payer: Medicare Other

## 2021-08-11 ENCOUNTER — Other Ambulatory Visit: Payer: Self-pay

## 2021-08-11 ENCOUNTER — Emergency Department
Admission: EM | Admit: 2021-08-11 | Discharge: 2021-08-11 | Disposition: A | Discharge status: Home / Self Care | Payer: Medicare Other | Attending: Emergency Medicine | Admitting: Emergency Medicine

## 2021-08-11 DIAGNOSIS — R1033 Periumbilical pain: Secondary | ICD-10-CM | POA: Insufficient documentation

## 2021-08-11 DIAGNOSIS — Z7952 Long term (current) use of systemic steroids: Secondary | ICD-10-CM | POA: Diagnosis not present

## 2021-08-11 DIAGNOSIS — Z20822 Contact with and (suspected) exposure to covid-19: Secondary | ICD-10-CM | POA: Diagnosis not present

## 2021-08-11 DIAGNOSIS — J45909 Unspecified asthma, uncomplicated: Secondary | ICD-10-CM | POA: Insufficient documentation

## 2021-08-11 DIAGNOSIS — R000 Tachycardia, unspecified: Secondary | ICD-10-CM | POA: Insufficient documentation

## 2021-08-11 DIAGNOSIS — R109 Unspecified abdominal pain: Secondary | ICD-10-CM | POA: Diagnosis present

## 2021-08-11 DIAGNOSIS — R1084 Generalized abdominal pain: Secondary | ICD-10-CM

## 2021-08-11 LAB — COMPREHENSIVE METABOLIC PANEL
ALT: 17 U/L (ref 0–44)
AST: 17 U/L (ref 15–41)
Albumin: 3.5 g/dL (ref 3.5–5.0)
Alkaline Phosphatase: 101 U/L (ref 38–126)
Anion gap: 8 (ref 5–15)
BUN: 11 mg/dL (ref 6–20)
CO2: 29 mmol/L (ref 22–32)
Calcium: 8.9 mg/dL (ref 8.9–10.3)
Chloride: 102 mmol/L (ref 98–111)
Creatinine, Ser: 1.03 mg/dL — ABNORMAL HIGH (ref 0.44–1.00)
GFR, Estimated: >60 mL/min (ref >60)
Glucose, Bld: 101 mg/dL — ABNORMAL HIGH (ref 70–99)
Potassium: 3.8 mmol/L (ref 3.5–5.1)
Sodium: 139 mmol/L (ref 135–145)
Total Bilirubin: 0.7 mg/dL (ref 0.3–1.2)
Total Protein: 7.1 g/dL (ref 6.5–8.1)

## 2021-08-11 LAB — CBC
HCT: 39 % (ref 36.0–46.0)
Hemoglobin: 11.7 g/dL — ABNORMAL LOW (ref 12.0–15.0)
MCH: 24.8 pg — ABNORMAL LOW (ref 26.0–34.0)
MCHC: 30 g/dL (ref 30.0–36.0)
MCV: 82.6 fL (ref 80.0–100.0)
Platelets: 300 10*3/uL (ref 150–400)
RBC: 4.72 MIL/uL (ref 3.87–5.11)
RDW: 15.8 % — ABNORMAL HIGH (ref 11.5–15.5)
WBC: 4.9 10*3/uL (ref 4.0–10.5)
nRBC: 0 % (ref 0.0–0.2)

## 2021-08-11 LAB — URINALYSIS, COMPLETE (UACMP) WITH MICROSCOPIC
Bilirubin Urine: NEGATIVE
Glucose, UA: NEGATIVE mg/dL
Hgb urine dipstick: NEGATIVE
Ketones, ur: NEGATIVE mg/dL
Leukocytes,Ua: NEGATIVE
Nitrite: NEGATIVE
Protein, ur: NEGATIVE mg/dL
Specific Gravity, Urine: 1.021 (ref 1.005–1.030)
pH: 5 (ref 5.0–8.0)

## 2021-08-11 LAB — RESP PANEL BY RT-PCR (FLU A&B, COVID) ARPGX2
Influenza A by PCR: NEGATIVE
Influenza B by PCR: NEGATIVE
SARS Coronavirus 2 by RT PCR: NEGATIVE

## 2021-08-11 LAB — POC URINE PREG, ED: Preg Test, Ur: NEGATIVE

## 2021-08-11 LAB — LIPASE, BLOOD: Lipase: 26 U/L (ref 11–51)

## 2021-08-11 MED ORDER — IOHEXOL 350 MG/ML SOLN
100.0000 mL | Freq: Once | INTRAVENOUS | Status: AC | PRN
  Administered 2021-08-11: 100 mL via INTRAVENOUS

## 2021-08-11 MED ORDER — DICYCLOMINE HCL 10 MG PO CAPS: 10.0000 mg | ORAL_CAPSULE | Freq: Two times a day (BID) | ORAL | 0 refills | Status: DC | PRN

## 2021-08-11 MED ORDER — SODIUM CHLORIDE 0.9 % IV BOLUS
1000.0000 mL | Freq: Once | INTRAVENOUS | Status: AC
  Administered 2021-08-11: 1000 mL via INTRAVENOUS

## 2021-08-11 MED ORDER — FOSFOMYCIN TROMETHAMINE 3 G PO PACK
3.0000 g | PACK | Freq: Once | ORAL | Status: AC
  Administered 2021-08-11: 3 g via ORAL
  Filled 2021-08-11: qty 3

## 2021-08-11 NOTE — ED Notes (Signed)
Pt given water 

## 2021-08-11 NOTE — ED Triage Notes (Addendum)
Pt arrives from home via [facility] with sharp, non radiating abdominal pain that has been going on for a week. EMS reports pt has been having some diarrhea, no N/V.

## 2021-08-11 NOTE — ED Provider Notes (Signed)
[facility] Emergency Department Provider Note  Time seen: 9:45 AM  I have reviewed the triage vital signs and the nursing notes.   HISTORY  Chief Complaint Abdominal Pain   HPI Erica Webster is a 35 y.o. female with a past medical history of anxiety, depression, presents to the emergency department for abdominal pain.  According to the patient over the past week or so she has been experiencing mid abdominal pain.  Describes it as aching type pain in the mid abdomen fairly constant for the past [redacted] week along with diarrhea.  Denies any nausea or vomiting.  No fever cough or congestion.  Denies dysuria or hematuria no vaginal bleeding or discharge.   Past Medical History:  Diagnosis Date   Anxiety    Asthma    Depression    Dyspnea    Sleep apnea     Patient Active Problem List   Diagnosis Date Noted   OSA (obstructive sleep apnea) 06/14/2021   Moderate persistent asthma 06/14/2021   OSA on CPAP 02/15/2021   CPAP use counseling 02/15/2021   Morbid obesity ([place]) 02/15/2021   Status post laparoscopic cholecystectomy 01/26/2021   Severe obesity (BMI >= 40) ([place]) 12/10/2020    Past Surgical History:  Procedure Laterality Date   CHOLECYSTECTOMY     COLONOSCOPY WITH PROPOFOL N/A 03/12/2021   Procedure: COLONOSCOPY WITH PROPOFOL;  Surgeon: Lesly Rubenstein, MD;  Location: [facility] ENDOSCOPY;  Service: Endoscopy;  Laterality: N/A;   ESOPHAGOGASTRODUODENOSCOPY (EGD) WITH PROPOFOL N/A 03/12/2021   Procedure: ESOPHAGOGASTRODUODENOSCOPY (EGD) WITH PROPOFOL;  Surgeon: Lesly Rubenstein, MD;  Location: [facility] ENDOSCOPY;  Service: Endoscopy;  Laterality: N/A;   TOENAIL EXCISION      Prior to Admission medications   Medication Sig Start Date End Date Taking? Authorizing Provider  albuterol (PROVENTIL HFA;VENTOLIN HFA) 108 (90 Base) MCG/ACT inhaler Inhale 2 puffs into the lungs every 4 (four) hours as needed for wheezing or shortness of breath.    [provider]  albuterol (PROVENTIL) (2.5 MG/3ML) 0.083% nebulizer solution Take 2.5 mg by nebulization every 4 (four) hours as needed. 01/28/21   [provider]  ARIPiprazole (ABILIFY) 10 MG tablet Take 1.5 tablets (15 mg total) by mouth daily.    [provider]  Cholecalciferol (VITAMIN D3) 50 MCG (2000 UT) TABS Take 2,000 Units by mouth daily.    [provider]  dicyclomine (BENTYL) 10 MG capsule Take 10 mg by mouth 4 (four) times daily -  before meals and at bedtime.    [provider]  FLOVENT HFA 110 MCG/ACT inhaler Inhale 2 puffs into the lungs 2 (two) times daily. 11/24/20   [provider]  fluticasone-salmeterol (ADVAIR DISKUS) 250-50 MCG/ACT AEPB Inhale 1 puff into the lungs in the morning and at bedtime.    [provider]  HYDROcodone-acetaminophen (NORCO/VICODIN) 5-325 MG tablet Take 1 tablet by mouth every 6 (six) hours as needed for moderate pain.    [provider]  ibuprofen (ADVIL) 800 MG tablet Take 1 tablet (800 mg total) by mouth every 8 (eight) hours as needed. 01/11/21   Ronny Bacon, MD  lamoTRIgine (LAMICTAL) 150 MG tablet Take 150 mg by mouth daily. 04/07/21   [provider]  lamoTRIgine (LAMICTAL) 25 MG tablet Take 25 mg by mouth daily.    [provider]  montelukast (SINGULAIR) 10 MG tablet Take 1 tablet by mouth daily. 01/28/21   [provider]  ondansetron (ZOFRAN) 4 MG tablet Take 4 mg by  mouth every 8 (eight) hours as needed for nausea or vomiting.    [provider]  Spacer/Aero Chamber Mouthpiece MISC 1 Units by Does not apply route every 4 (four) hours as needed (wheezing). 10/30/17   Merrily Brittle, MD  vitamin B-12 (CYANOCOBALAMIN) 500 MCG tablet Take 500 mcg by mouth daily.    [provider]    Allergies  Allergen Reactions   Shellfish Allergy Anaphylaxis   Penicillins Itching    Family History  Adopted: Yes  Problem Relation Age of Onset    Breast cancer Neg Hx     Social History Social History   Tobacco Use   Smoking status: Never   Smokeless tobacco: Never  Vaping Use   Vaping Use: Never used  Substance Use Topics   Alcohol use: No   Drug use: No    Review of Systems Constitutional: Negative for fever. Cardiovascular: Negative for chest pain. Respiratory: Negative for shortness of breath. Gastrointestinal: Moderate mid abdominal pain.  Positive for diarrhea.  Negative for nausea vomiting Genitourinary: Negative for urinary compaints Musculoskeletal: Negative for musculoskeletal complaints Neurological: Negative for headache All other ROS negative  ____________________________________________   PHYSICAL EXAM:  VITAL SIGNS: ED Triage Vitals  Enc Vitals Group     BP 08/11/21 0922 (!) 133/95     Pulse Rate 08/11/21 0922 (!) 104     Resp 08/11/21 0922 (!) 21     Temp 08/11/21 0922 98.2 F (36.8 C)     Temp Source 08/11/21 0922 Oral     SpO2 08/11/21 0922 98 %     Weight 08/11/21 0923 (!) 337 lb (152.9 kg)     Height 08/11/21 0923 5\' 1"  (1.549 m)     Head Circumference --      Peak Flow --      Pain Score 08/11/21 0923 9     Pain Loc --      Pain Edu? --      Excl. in GC? --    Constitutional: Alert and oriented. Well appearing and in no distress. Eyes: Normal exam ENT      Mouth/Throat: Mucous membranes are moist. Cardiovascular: Normal rate, regular rhythm.  Respiratory: Normal respiratory effort without tachypnea nor retractions. Breath sounds are clear  Gastrointestinal: Soft, no distention.  Patient states tenderness around the periumbilical region with palpation.  No rebound guarding or distention. Musculoskeletal: Nontender with normal range of motion in all extremities.  Neurologic:  Normal speech and language. No gross focal neurologic deficits  Skin:  Skin is warm, dry and intact.  Psychiatric: Mood and affect are normal.   ____________________________________________    EKG  EKG  viewed and interpreted by myself shows sinus tachycardia 102 bpm with a narrow QRS, normal axis, normal intervals, no concerning ST changes.  ____________________________________________    RADIOLOGY  CT negative for acute abnormality  ____________________________________________   INITIAL IMPRESSION / ASSESSMENT AND PLAN / ED COURSE  Pertinent labs & imaging results that were available during my care of the patient were reviewed by me and considered in my medical decision making (see chart for details).   Patient presents emergency department for mid abdominal pain.  States symptoms have been ongoing for the past 1 week associated diarrhea as well.  No nausea or vomiting.  No fever.  No dysuria or vaginal symptoms.  We will check labs, obtain CT imaging of the abdomen/pelvis to further evaluate.  Differential is quite broad but would include intra-abdominal pathology such as appendicitis, diverticulitis, colitis,  UTI, intestinal pains.  CT scan is negative for acute abnormality.  Lab work is reassuring.  Urinalysis does show bacteria we will cover the one-time dose of fosfomycin urine culture has been added on.  We will discharge patient with PCP follow-up.  Patient agreeable to plan of care.  Erica Webster was evaluated in Emergency Department on 08/11/2021 for the symptoms described in the history of present illness. She was evaluated in the context of the global COVID-19 pandemic, which necessitated consideration that the patient might be at risk for infection with the SARS-CoV-2 virus that causes COVID-19. Institutional protocols and algorithms that pertain to the evaluation of patients at risk for COVID-19 are in a state of rapid change based on information released by regulatory bodies including the CDC and federal and state organizations. These policies and algorithms were followed during the patient's care in the ED.  ____________________________________________   FINAL CLINICAL  IMPRESSION(S) / ED DIAGNOSES  Abdominal pain   Harvest Dark, MD 08/11/21 1432

## 2021-08-11 NOTE — ED Notes (Signed)
Patient transported to CT 

## 2021-08-11 NOTE — ED Notes (Signed)
Pt given warm blanket.

## 2021-08-12 LAB — URINE CULTURE: Culture: NO GROWTH

## 2021-11-15 ENCOUNTER — Emergency Department: Payer: Medicare Other

## 2021-11-15 ENCOUNTER — Other Ambulatory Visit: Payer: Self-pay

## 2021-11-15 ENCOUNTER — Emergency Department
Admission: EM | Admit: 2021-11-15 | Discharge: 2021-11-15 | Disposition: A | Discharge status: Home / Self Care | Payer: Medicare Other | Attending: Emergency Medicine | Admitting: Emergency Medicine

## 2021-11-15 DIAGNOSIS — M79605 Pain in left leg: Secondary | ICD-10-CM | POA: Insufficient documentation

## 2021-11-15 DIAGNOSIS — M79606 Pain in leg, unspecified: Secondary | ICD-10-CM

## 2021-11-15 MED ORDER — MELOXICAM 15 MG PO TABS: 15.0000 mg | ORAL_TABLET | Freq: Every day | ORAL | 0 refills | Status: DC

## 2021-11-15 NOTE — ED Triage Notes (Signed)
Pt come with c/o left leg pain.

## 2021-11-15 NOTE — ED Provider Notes (Signed)
[facility] Provider Note  Patient Contact: 4:49 PM (approximate)   History   Leg Pain   HPI  Erica Webster is a 36 y.o. female who presents the emergency department complaining of leg pain.  Patient has an area to the anterolateral thigh of the left leg that is painful.  Does not radiate up and down the leg.  No back pain.  No reported trauma.  Patient denies any erythema or edema to the area.  No open wounds and no areas of ecchymosis.  Worse with palpation of the area.     Physical Exam   Triage Vital Signs: ED Triage Vitals  Enc Vitals Group     BP 11/15/21 1356 (!) 137/91     Pulse Rate 11/15/21 1356 (!) 105     Resp 11/15/21 1356 18     Temp 11/15/21 1356 98 F (36.7 C)     Temp src --      SpO2 11/15/21 1356 100 %     Weight --      Height --      Head Circumference --      Peak Flow --      Pain Score 11/15/21 1349 5     Pain Loc --      Pain Edu? --      Excl. in GC? --     Most recent vital signs: Vitals:   11/15/21 1356  BP: (!) 137/91  Pulse: (!) 105  Resp: 18  Temp: 98 F (36.7 C)  SpO2: 100%     General: Alert and in no acute distress.  Cardiovascular:  Good peripheral perfusion Respiratory: Normal respiratory effort without tachypnea or retractions. Lungs CTAB.  Musculoskeletal: Full range of motion to all extremities.  Visualization of bilateral legs reveals no erythema or edema.  No obvious signs of trauma to the left leg.  Patient is tender over the left quad without palpable abnormality or deficit.  There is no extension of tenderness into the hip or knee joints.  Dorsalis pedis pulses sensation intact distally. Neurologic:  No gross focal neurologic deficits are appreciated.  Skin:   No rash noted Other:   ED Results / Procedures / Treatments   Labs (all labs ordered are listed, but only abnormal results are displayed) Labs Reviewed - No data to display   EKG     RADIOLOGY  I personally viewed  and evaluated these images as part of my medical decision making, as well as reviewing the written report by the radiologist.  ED Provider Interpretation: Ultrasound reveals no evidence of DVT.  No acute musculoskeletal abnormality on x-ray of the left femur  US Venous Img Lower Unilateral Left (DVT)  Result Date: 11/15/2021 CLINICAL DATA:  Left lower extremity pain EXAM: LEFT LOWER EXTREMITY VENOUS DOPPLER ULTRASOUND TECHNIQUE: Gray-scale sonography with graded compression, as well as color Doppler and duplex ultrasound were performed to evaluate the lower extremity deep venous systems from the level of the common femoral vein and including the common femoral, femoral, profunda femoral, popliteal and calf veins including the posterior tibial, peroneal and gastrocnemius veins when visible. The superficial great saphenous vein was also interrogated. Spectral Doppler was utilized to evaluate flow at rest and with distal augmentation maneuvers in the common femoral, femoral and popliteal veins. COMPARISON:  None. FINDINGS: Contralateral Common Femoral Vein: Respiratory phasicity is normal and symmetric with the symptomatic side. No evidence of thrombus. Normal compressibility. Common Femoral Vein: No evidence of thrombus. Normal  compressibility, respiratory phasicity and response to augmentation. Saphenofemoral Junction: No evidence of thrombus. Normal compressibility and flow on color Doppler imaging. Profunda Femoral Vein: No evidence of thrombus. Normal compressibility and flow on color Doppler imaging. Femoral Vein: No evidence of thrombus. Normal compressibility, respiratory phasicity and response to augmentation. Popliteal Vein: No evidence of thrombus. Normal compressibility, respiratory phasicity and response to augmentation. Calf Veins: No evidence of thrombus. Normal compressibility and flow on color Doppler imaging. IMPRESSION: No evidence of deep venous thrombosis. Electronically Signed   By: Judie Petit.   Shick M.D.   On: 11/15/2021 17:24   DG Femur Min 2 Views Left  Result Date: 11/15/2021 CLINICAL DATA:  Left thigh pain EXAM: LEFT FEMUR 2 VIEWS COMPARISON:  None. FINDINGS: Frontal and lateral views of the left femur are obtained. No acute displaced fracture. Alignment is anatomic. Soft tissues are unremarkable. IMPRESSION: 1. Unremarkable left femur. Electronically Signed   By: Sharlet Salina M.D.   On: 11/15/2021 17:26    PROCEDURES:  Critical Care performed: No  Procedures   MEDICATIONS ORDERED IN ED: Medications - No data to display   IMPRESSION / MDM / ASSESSMENT AND PLAN / ED COURSE  I reviewed the triage vital signs and the nursing notes.                              Differential diagnosis includes, but is not limited to, musculoskeletal pain, contusion, DVT, fracture   Patient's diagnosis is consistent with musculoskeletal leg pain.  Patient presented to the emergency department complaining of left leg pain.  This is over the patient's left quad.  No palpable abnormality and no trauma.  No erythema or edema.  Given the complaint patient had imaging with x-ray and ultrasound.  No evidence of acute musculoskeletal trauma and no evidence of DVT.  Patient will have anti-inflammatory for symptom control.  Concerning signs and symptoms discussed with the patient.  Follow-up primary care as needed.  Return to the ED if symptoms are worsening.. . Patient is given ED precautions to return to the ED for any worsening or new symptoms.    Clinical Course as of 11/15/21 1910  Mon Nov 15, 2021  1727 US Venous Img Lower Unilateral Left (DVT) [AE]  1728 DG Femur Min 2 Views Left [AE]  1729 US Venous Img Lower Unilateral Left (DVT) [AE]    Clinical Course User Index [AE] Patsy Lager, Student-PA     FINAL CLINICAL IMPRESSION(S) / ED DIAGNOSES   Final diagnoses:  Leg pain     Rx / DC Orders   ED Discharge Orders          Ordered    meloxicam (MOBIC) 15 MG tablet  Daily         11/15/21 1906             Note:  This document was prepared using Dragon voice recognition software and may include unintentional dictation errors.   Lanette Hampshire 11/15/21 1910    Sharman Cheek, MD 11/16/21 [phone]

## 2021-12-06 ENCOUNTER — Ambulatory Visit: Payer: Medicare Other | Admitting: Internal Medicine

## 2021-12-06 NOTE — Progress Notes (Signed)
Sleep Medicine   Office Visit  Patient Name: Erica Webster DOB: 06/28/1967 MRN [id number]    Chief Complaint: ***  Brief History:  Erica presents for initial sleep consult with a *** history of ***. Sleep quality is ***. This is noted *** nights. The patient's bed partner reports  *** at night. The patient relates the following symptoms: *** are also present. The patient goes to sleep at *** and wakes up at ***.   Sleep quality is *** when outside home environment.  Patient has noted *** of her legs at night.  The patient  relates *** behavior during the night.  The patient *** a history of psychiatric problems. The Epworth Sleepiness Score is *** out of 24 .  The patient relates  Cardiovascular risk factors include: *** The patient reports ***    ROS  General: (-) fever, (-) chills, (-) night sweat Nose and Sinuses: (-) nasal stuffiness or itchiness, (-) postnasal drip, (-) nosebleeds, (-) sinus trouble. Mouth and Throat: (-) sore throat, (-) hoarseness. Neck: (-) swollen glands, (-) enlarged thyroid, (-) neck pain. Respiratory: *** cough, *** shortness of breath, *** wheezing. Neurologic: *** numbness, *** tingling. Psychiatric: *** anxiety, *** depression Sleep behavior: ***sleep paralysis ***hypnogogic hallucinations ***dream enactment      ***vivid dreams ***cataplexy ***night terrors ***sleep walking   Current Medication: No outpatient encounter medications on file as of 11/24/2022.   No facility-administered encounter medications on file as of 11/24/2022.    Surgical History: *** The histories are not reviewed yet. Please review them in the "History" navigator section and refresh this SmartLink.  Medical History: No past medical history on file.  Family History: Non contributory to the present illness  Social History: Social History   Socioeconomic History   Marital status: Not on file    Spouse name: Not on file   Number of children: Not on file   Years of  education: Not on file   Highest education level: Not on file  Occupational History   Not on file  Tobacco Use   Smoking status: Not on file   Smokeless tobacco: Not on file  Substance and Sexual Activity   Alcohol use: Not on file   Drug use: Not on file   Sexual activity: Not on file  Other Topics Concern   Not on file  Social History Narrative   Not on file   Social Determinants of Health   Financial Resource Strain: Not on file  Food Insecurity: Not on file  Transportation Needs: Not on file  Physical Activity: Not on file  Stress: Not on file  Social Connections: Not on file  Intimate Partner Violence: Not on file    Vital Signs: There were no vitals taken for this visit. There is no height or weight on file to calculate BMI.   Examination: General Appearance: The patient is well-developed, well-nourished, and in no distress. Neck Circumference: *** Skin: Gross inspection of skin unremarkable. Head: normocephalic, no gross deformities. Eyes: no gross deformities noted. ENT: ears appear grossly normal Neurologic: Alert and oriented. No involuntary movements.    STOP BANG RISK ASSESSMENT S (snore) Have you been told that you snore?     YES/N   T (tired) Are you often tired, fatigued, or sleepy during the day?   YES/NO  O (obstruction) Do you stop breathing, choke, or gasp during sleep? YES/NO   P (pressure) Do you have or are you being treated for high blood pressure? YES/NO   B (  BMI) Is your body index greater than 35 kg/m? YES/NO   A (age) Are you 50 years old or older? YES/NO   N (neck) Do you have a neck circumference greater than 16 inches?   YES/NO   G (gender) Are you a female? YES/NO   TOTAL STOP/BANG "YES" ANSWERS                                                                A STOP-Bang score of 2 or less is considered low risk, and a score of 5 or more is high risk for having either moderate or severe OSA. For people who score 3 or 4,  doctors may need to perform further assessment to determine how likely they are to have OSA.         EPWORTH SLEEPINESS SCALE:  Scale:  (0)= no chance of dozing; (1)= slight chance of dozing; (2)= moderate chance of dozing; (3)= high chance of dozing  Chance  Situtation    Sitting and reading: ***    Watching TV: ***    Sitting Inactive in public: ***    As a passenger in car: ***      Lying down to rest: ***    Sitting and talking: ***    Sitting quielty after lunch: ***    In a car, stopped in traffic: ***   TOTAL SCORE:   *** out of 24    SLEEP STUDIES:  ***   LABS: No results found for this or any previous visit (from the past 2160 hour(s)).  Radiology: Patient was never admitted.  No results found.  No results found.    Assessment and Plan: There are no problems to display for this patient.    PLAN OSA:   Patient evaluation suggests high risk of sleep disordered breathing due to *** Patient has comorbid cardiovascular risk factors including: *** which could be exacerbated by pathologic sleep-disordered breathing.  Suggest: *** to assess/treat the patient's sleep disordered breathing. The patient was also counselled on *** to optimize sleep health.  PLAN hypersomnia:  Patient evaluation suggests significant daytime hypersomnia.  The Epworth Sleepiness Score is elevated at *** out of 24. Patient *** drowsy driving. The patient *** MVA due to sleepiness.  The patient *** restless leg symptoms which exacerbate *** for *** nights per week. The patient *** periodic limb movements which exacerbate ***  for *** nights per week. Suggest: ***  Also suggest ***  PLAN insomnia:  Patient evaluation suggests *** insomnia. This is a chronic disorder. This has been a concern for *** and causes impaired daytime functioning. The patient exhibits comorbid ***  The history *** suggest the insomnia predates the use of hypnotic medications. The symptoms *** with the  discontinuation of these medications. There is no obvious medical, psychiatric or pharmacologic abuse issues ot account for the insomnia.  Treatment recommendations include: *** The patient should maintain a sleep log and calculate total sleep time for 1-2 weeks. Set bed and wake times for achieve 85% sleep efficiency for one week. Once this is achieved  time in bed can be gradually increased. A pharmacologic treatment approach would include a trial of *** for the next ***  months. During this time the patient is to maintain a sleep   diary to track progress.    ***  General Counseling: I have discussed the findings of the evaluation and examination with Erica.  I have also discussed any further diagnostic evaluation thatmay be needed or ordered today. Erica verbalizes understanding of the findings of todays visit. We also reviewed her medications today and discussed drug interactions and side effects including but not limited excessive drowsiness and altered mental states. We also discussed that there is always a risk not just to her but also people around her. she has been encouraged to call the office with any questions or concerns that should arise related to todays visit.  No orders of the defined types were placed in this encounter.       I have personally obtained a history, evaluated the patient, evaluated pertinent data, formulated the assessment and plan and placed orders.    Geralyn Figiel A Laurna Shetley, MD FCCP Diplomate ABMS Pulmonary and Critical Care Medicine Sleep medicine  
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
## 2021-12-09 ENCOUNTER — Encounter: Payer: Self-pay | Admitting: Psychiatry

## 2021-12-09 ENCOUNTER — Ambulatory Visit (INDEPENDENT_AMBULATORY_CARE_PROVIDER_SITE_OTHER): Payer: Medicare Other | Admitting: Psychiatry

## 2021-12-09 ENCOUNTER — Other Ambulatory Visit: Payer: Self-pay

## 2021-12-09 VITALS — BP 153/96 | HR 114 | Temp 98.7°F | Wt 338.2 lb

## 2021-12-09 DIAGNOSIS — F3341 Major depressive disorder, recurrent, in partial remission: Secondary | ICD-10-CM | POA: Diagnosis not present

## 2021-12-09 MED ORDER — ARIPIPRAZOLE 10 MG PO TABS: 10.0000 mg | ORAL_TABLET | Freq: Every day | ORAL | 0 refills | Status: DC

## 2022-01-09 IMAGING — CT CT ABD-PELV W/ CM
2 of 4 series · 16 of 46 positions shown, 18 images · IV contrast (APPLIED)
Comparison: None.

CLINICAL DATA: Lower abdominal pain for 3 days, diarrhea

EXAM:
CT ABDOMEN AND PELVIS WITH CONTRAST
TECHNIQUE: Multidetector CT imaging of the abdomen and pelvis was performed
using the standard protocol following bolus administration of
intravenous contrast.
CONTRAST:  100mL OMNIPAQUE IOHEXOL 300 MG/ML  SOLN

[Series 2: routine abd/pel with · axial · 0.98mm/px · z∈[-204,+206]mm · 13 of 90 slices shown, 15 images]
[im 4/90  soft-tissue]
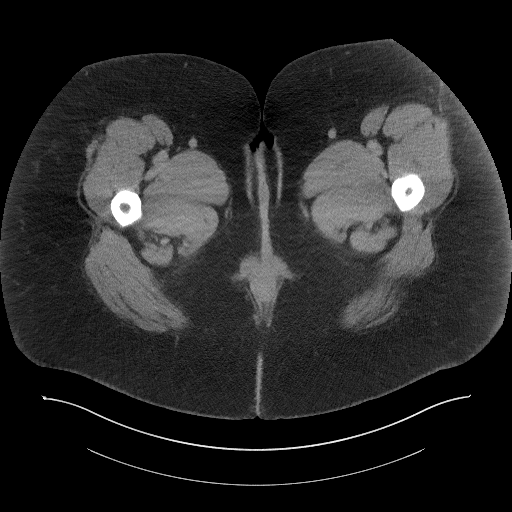
[im 4/90  bone]
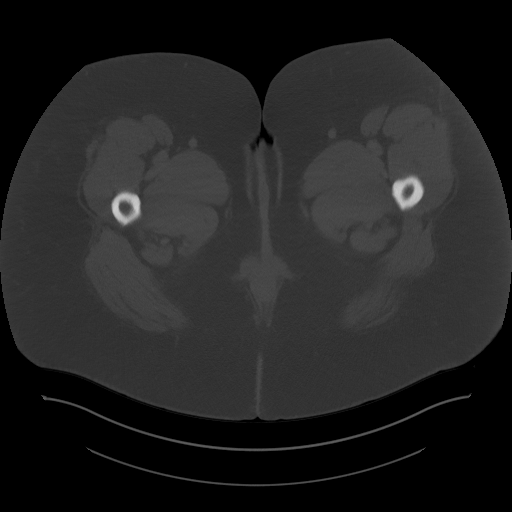
[im 11/90  soft-tissue]
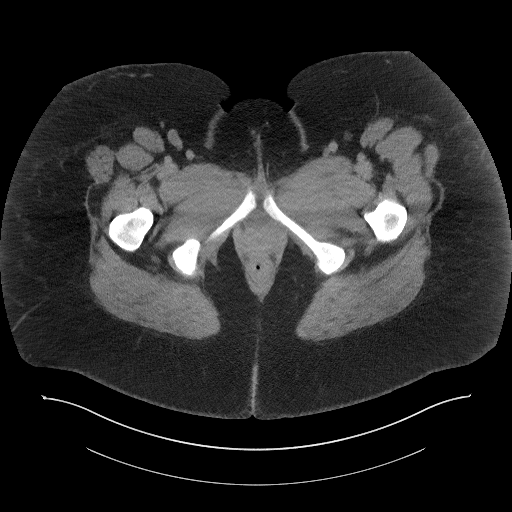
[im 18/90  soft-tissue]
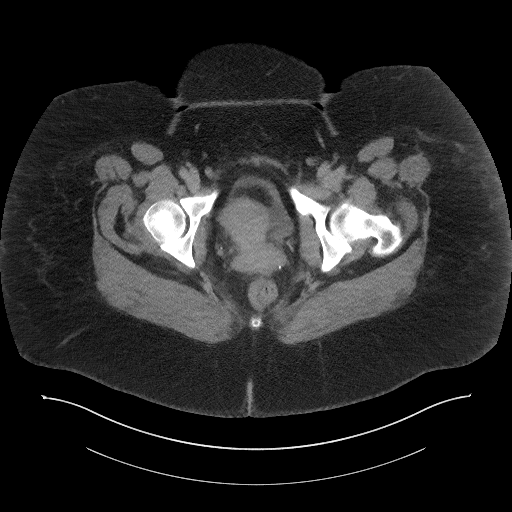
[im 25/90  soft-tissue]
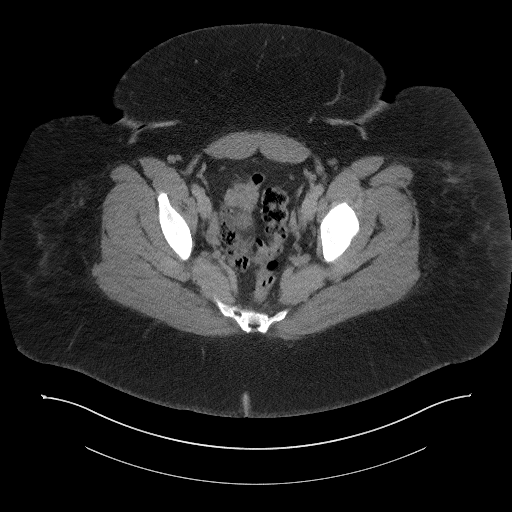
[im 33/90  soft-tissue]
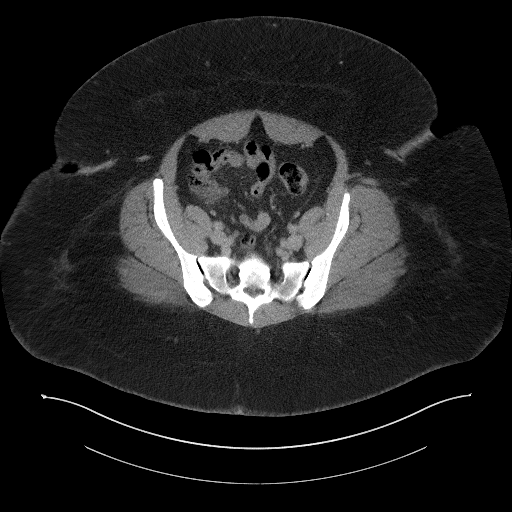
[im 40/90  soft-tissue]
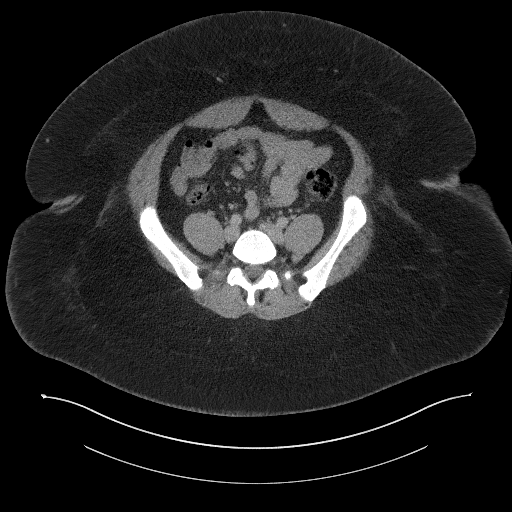
[im 47/90  soft-tissue]
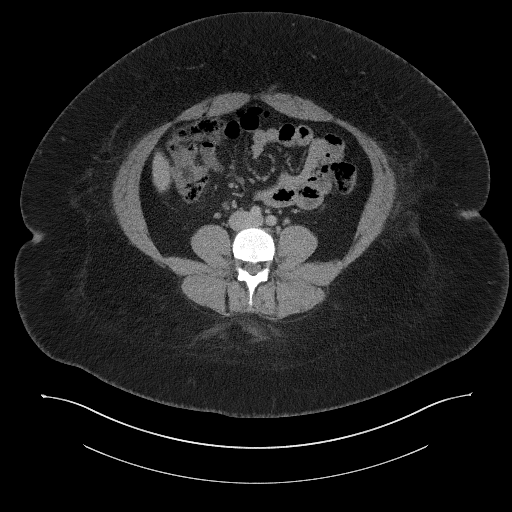
[im 50/90  soft-tissue]
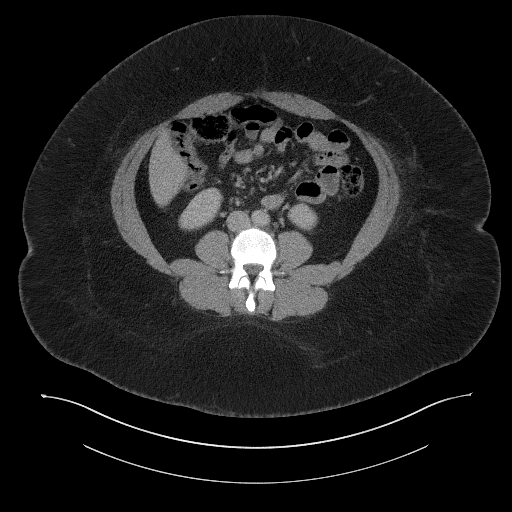
[im 57/90  soft-tissue]
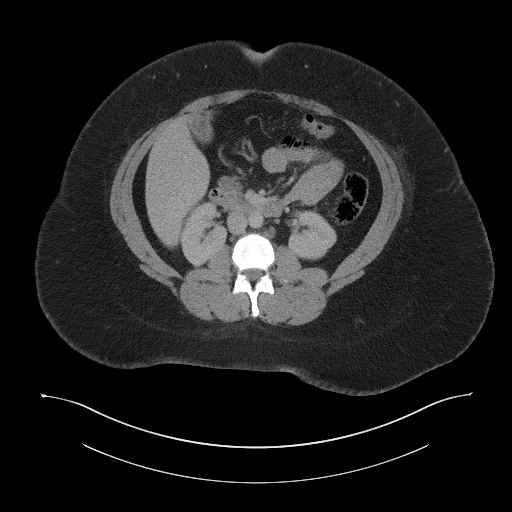
[im 57/90  bone]
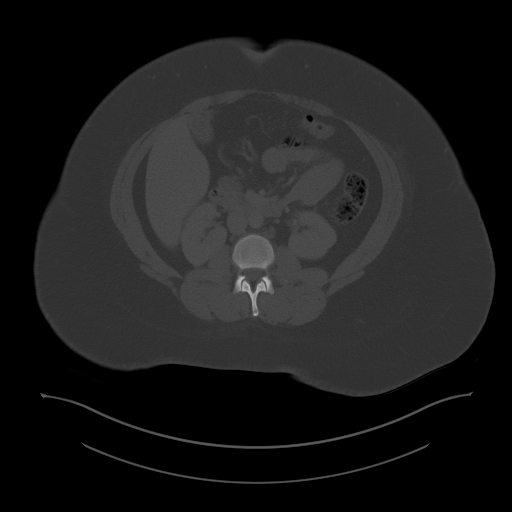
[im 65/90  soft-tissue]
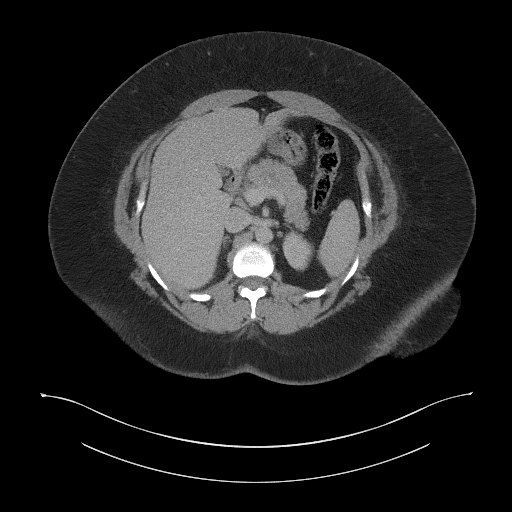
[im 72/90  soft-tissue]
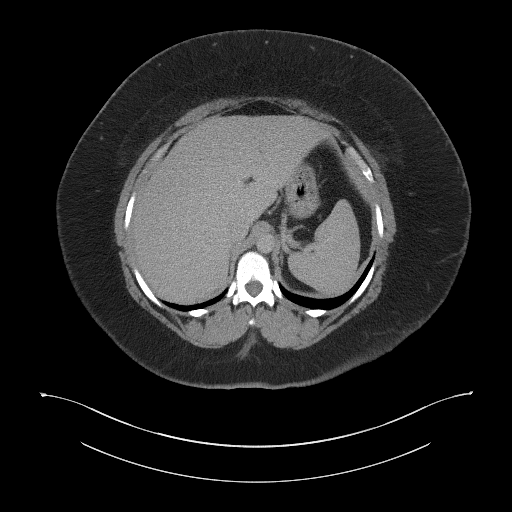
[im 79/90  soft-tissue]
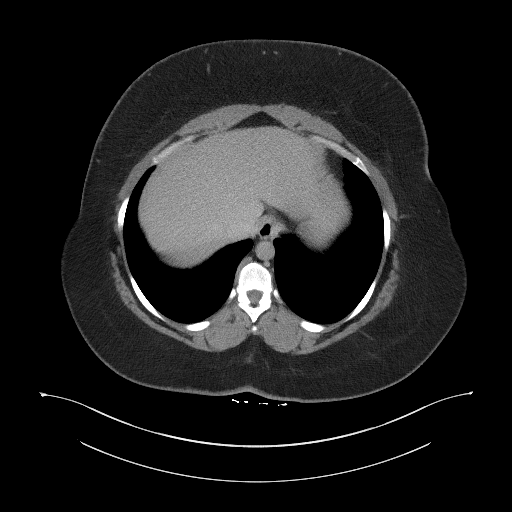
[im 86/90  soft-tissue]
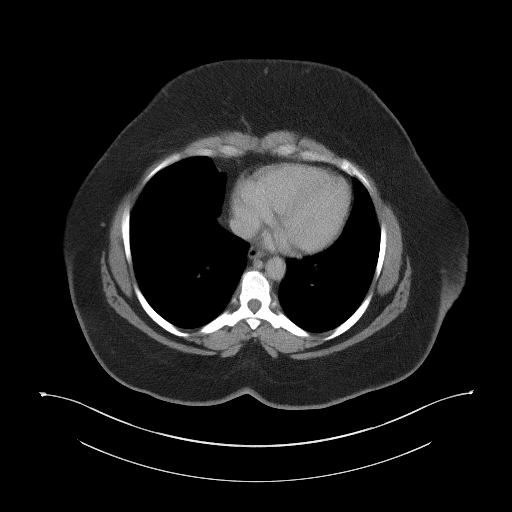

[Series 5: coronal st · coronal · 0.89mm/px · 3 of 105 slices shown]
[im 35/105  soft-tissue]
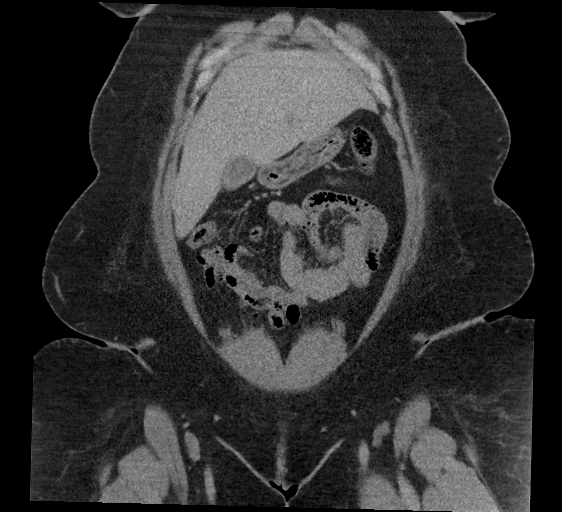
[im 47/105  soft-tissue]
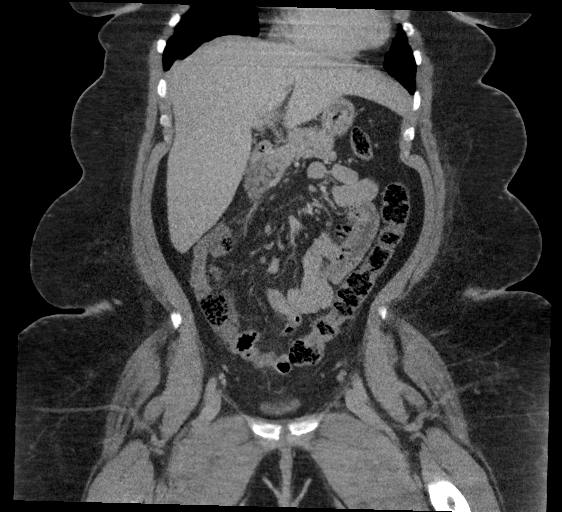
[im 58/105  soft-tissue]
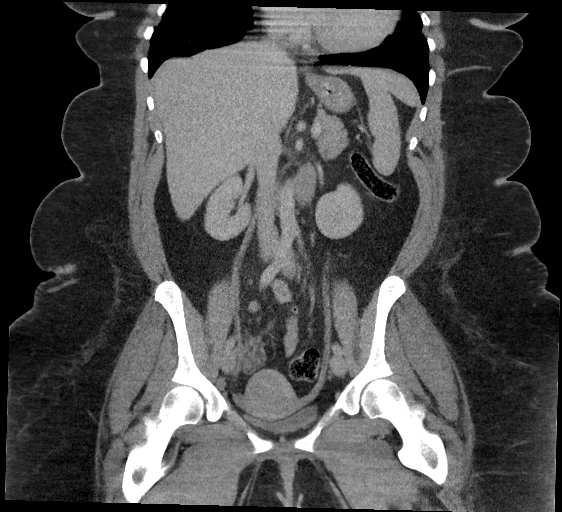

[16 of 46 positions shown; findings below may reference images not displayed]

FINDINGS: Lower chest: No acute pleural or parenchymal lung disease.

Hepatobiliary: Calcified gallstone is seen within the gallbladder
lumen. Gallbladder is minimally distended, with no gross evidence of
gallbladder wall thickening or pericholecystic fluid. The liver is
unremarkable. No biliary dilation.

Pancreas: Unremarkable. No pancreatic ductal dilatation or
surrounding inflammatory changes.

Spleen: Normal in size without focal abnormality.

Adrenals/Urinary Tract: Adrenal glands are unremarkable. Kidneys are
normal, without renal calculi, focal lesion, or hydronephrosis.
Bladder is unremarkable.

Stomach/Bowel: No bowel obstruction or ileus. Normal appendix within
the central upper pelvis. No bowel wall thickening or inflammatory
change.

Vascular/Lymphatic: No significant vascular findings are present. No
enlarged abdominal or pelvic lymph nodes.

Reproductive: Uterus and bilateral adnexa are unremarkable.

Other: No free fluid or free gas.  No abdominal wall hernia.

Musculoskeletal: No acute or destructive bony lesions. Reconstructed
images demonstrate no additional findings.
IMPRESSION: 1. Cholelithiasis, with no CT evidence of acute cholecystitis. If
biliary pathology is suspected, correlation with right upper
quadrant ultrasound could be performed.
2. Normal appendix.
3. Otherwise unremarkable CT of the abdomen and pelvis.

## 2022-01-17 ENCOUNTER — Ambulatory Visit (INDEPENDENT_AMBULATORY_CARE_PROVIDER_SITE_OTHER): Payer: Medicare Other | Admitting: Internal Medicine

## 2022-01-17 VITALS — BP 147/103 | HR 100 | Resp 16 | Ht 62.0 in | Wt 330.0 lb

## 2022-01-17 DIAGNOSIS — Z9989 Dependence on other enabling machines and devices: Secondary | ICD-10-CM | POA: Diagnosis not present

## 2022-01-17 DIAGNOSIS — Z7189 Other specified counseling: Secondary | ICD-10-CM

## 2022-01-17 DIAGNOSIS — G4733 Obstructive sleep apnea (adult) (pediatric): Secondary | ICD-10-CM

## 2022-01-17 NOTE — Patient Instructions (Signed)

## 2022-01-17 NOTE — Progress Notes (Signed)
[facility] ?[address] ?[place], [place] [address] ? ?Pulmonary Sleep Medicine  ? ?Office Visit Note ? ?Patient Name: Erica Webster ?DOB: [birth year] ?MRN [id number] ? ? ? ?Chief Complaint: Obstructive Sleep Apnea visit ? ?Brief History: ? ?Fynlee is seen today for follow up The patient has a 11 month history of sleep apnea. Patient is not using PAP nightly.  The patient feels wide awake after sleeping with PAP.  The patient reports benefiting from PAP use. Reported sleepiness is  improved and the Epworth Sleepiness Score is 7 out of 24. The patient does take naps weekly. The patient complains of the following: none  The compliance download shows  compliance with an average use time of 5:03 hours@ 70%. The AHI is 0.1  The patient does complain of limb movements disrupting sleep. ? ?ROS ? ?General: (-) fever, (-) chills, (-) night sweat ?Nose and Sinuses: (-) nasal stuffiness or itchiness, (-) postnasal drip, (-) nosebleeds, (-) sinus trouble. ?Mouth and Throat: (-) sore throat, (-) hoarseness. ?Neck: (-) swollen glands, (-) enlarged thyroid, (-) neck pain. ?Respiratory: - cough, - shortness of breath, + wheezing. ?Neurologic: - numbness, - tingling. ?Psychiatric: + anxiety, + depression ? ? ?Current Medication: ?Outpatient Encounter Medications as of 01/17/2022  ?Medication Sig  ? albuterol (PROVENTIL HFA;VENTOLIN HFA) 108 (90 Base) MCG/ACT inhaler Inhale 2 puffs into the lungs every 4 (four) hours as needed for wheezing or shortness of breath.  ? albuterol (PROVENTIL) (2.5 MG/3ML) 0.083% nebulizer solution Take 2.5 mg by nebulization every 4 (four) hours as needed.  ? ARIPiprazole (ABILIFY) 10 MG tablet Take 1 tablet (10 mg total) by mouth daily.  ? Cholecalciferol (VITAMIN D3) 50 MCG (2000 UT) TABS Take 2,000 Units by mouth daily.  ? cyclobenzaprine (FLEXERIL) 10 MG tablet Take 10 mg by mouth 2 (two) times daily as needed for muscle spasms.  ? FLOVENT HFA 110 MCG/ACT inhaler Inhale 2 puffs into  the lungs 2 (two) times daily.  ? fluticasone-salmeterol (ADVAIR) 250-50 MCG/ACT AEPB Inhale 1 puff into the lungs in the morning and at bedtime.  ? HYDROcodone-acetaminophen (NORCO/VICODIN) 5-325 MG tablet Take 1 tablet by mouth every 6 (six) hours as needed for moderate pain.  ? ibuprofen (ADVIL) 800 MG tablet Take 1 tablet (800 mg total) by mouth every 8 (eight) hours as needed.  ? meloxicam (MOBIC) 15 MG tablet Take 1 tablet (15 mg total) by mouth daily.  ? montelukast (SINGULAIR) 10 MG tablet Take 1 tablet by mouth daily.  ? ondansetron (ZOFRAN) 4 MG tablet Take 4 mg by mouth every 8 (eight) hours as needed for nausea or vomiting.  ? Spacer/Aero Chamber Mouthpiece MISC 1 Units by Does not apply route every 4 (four) hours as needed (wheezing).  ? vitamin B-12 (CYANOCOBALAMIN) 500 MCG tablet Take 500 mcg by mouth daily.  ? ?No facility-administered encounter medications on file as of 01/17/2022.  ? ? ?Surgical History: ?Past Surgical History:  ?Procedure Laterality Date  ? CHOLECYSTECTOMY    ? COLONOSCOPY WITH PROPOFOL N/A 03/12/2021  ? Procedure: COLONOSCOPY WITH PROPOFOL;  Surgeon: Lesly Rubenstein, MD;  Location: [facility] ENDOSCOPY;  Service: Endoscopy;  Laterality: N/A;  ? ESOPHAGOGASTRODUODENOSCOPY (EGD) WITH PROPOFOL N/A 03/12/2021  ? Procedure: ESOPHAGOGASTRODUODENOSCOPY (EGD) WITH PROPOFOL;  Surgeon: Lesly Rubenstein, MD;  Location: [facility] ENDOSCOPY;  Service: Endoscopy;  Laterality: N/A;  ? TOENAIL EXCISION    ? ? ?Medical History: ?Past Medical History:  ?Diagnosis Date  ? Anxiety   ? Asthma   ? Depression   ?  Dyspnea   ? Sleep apnea   ? ? ?Family History: ?Non contributory to the present illness ? ?Social History: ?Social History  ? ?Socioeconomic History  ? Marital status: Single  ?  Spouse name: Not on file  ? Number of children: Not on file  ? Years of education: Not on file  ? Highest education level: Not on file  ?Occupational History  ? Not on file  ?Tobacco Use  ? Smoking status: Never  ? Smokeless  tobacco: Never  ?Vaping Use  ? Vaping Use: Never used  ?Substance and Sexual Activity  ? Alcohol use: No  ? Drug use: No  ? Sexual activity: Not on file  ?Other Topics Concern  ? Not on file  ?Social History Narrative  ? Not on file  ? ?Social Determinants of Health  ? ?Financial Resource Strain: Not on file  ?Food Insecurity: Not on file  ?Transportation Needs: Not on file  ?Physical Activity: Not on file  ?Stress: Not on file  ?Social Connections: Not on file  ?Intimate Partner Violence: Not on file  ? ? ?Vital Signs: ?Blood pressure (!) 147/103, pulse 100, resp. rate 16, height 5\' 2"  (1.575 m), weight (!) 330 lb (149.7 kg), SpO2 97 %. ?Body mass index is 60.36 kg/m?.  ? ? ?Examination: ?General Appearance: The patient is well-developed, well-nourished, and in no distress. ?Neck Circumference: 38 cm ?Skin: Gross inspection of skin unremarkable. ?Head: normocephalic, no gross deformities. ?Eyes: no gross deformities noted. ?ENT: ears appear grossly normal ?Neurologic: Alert and oriented. No involuntary movements. ? ? ? ?EPWORTH SLEEPINESS SCALE: ? ?Scale:  ?(0)= no chance of dozing; (1)= slight chance of dozing; (2)= moderate chance of dozing; (3)= high chance of dozing ? ?Chance  Situtation ?   ?Sitting and reading: 0 ?  ? Watching TV: 0 ?   ?Sitting Inactive in public: 1 ?   ?As a passenger in car: 2   ?   ?Lying down to rest: 3 ?   ?Sitting and talking: 0 ?   ?Sitting quielty after lunch: 1 ?   ?In a car, stopped in traffic: 0 ? ? ?TOTAL SCORE:   7 out of 24 ? ? ? ?SLEEP STUDIES: ? ?Split 10/01/2020  -  AHI 49.8 (REM AHI 102.9),  Min SpO2 62% ? ? ?CPAP COMPLIANCE DATA: ? ?   ONLY COMPLIANCE    ?Date Range: 10/17/21  -  11/15/2021  ? ?Average Daily Use: 5:03 hours ? ?Median Use: 6:25 hours ? ?Compliance for > 4 Hours: 70% days ? ?AHI: 0.1 respiratory events per hour ? ?Days Used: 25/30 ? ?Mask Leak: 10.2 lpm ? ?95th Percentile Pressure: 14cmH2O ? ? ?LABS: ?No results found for this or any previous visit (from the  past 2160 hour(s)). ? ?Radiology: ?US Venous Img Lower Unilateral Left (DVT) ? ?Result Date: 11/15/2021 ?CLINICAL DATA:  Left lower extremity pain EXAM: LEFT LOWER EXTREMITY VENOUS DOPPLER ULTRASOUND TECHNIQUE: Gray-scale sonography with graded compression, as well as color Doppler and duplex ultrasound were performed to evaluate the lower extremity deep venous systems from the level of the common femoral vein and including the common femoral, femoral, profunda femoral, popliteal and calf veins including the posterior tibial, peroneal and gastrocnemius veins when visible. The superficial great saphenous vein was also interrogated. Spectral Doppler was utilized to evaluate flow at rest and with distal augmentation maneuvers in the common femoral, femoral and popliteal veins. COMPARISON:  None. FINDINGS: Contralateral Common Femoral Vein: Respiratory phasicity is normal and symmetric with  the symptomatic side. No evidence of thrombus. Normal compressibility. Common Femoral Vein: No evidence of thrombus. Normal compressibility, respiratory phasicity and response to augmentation. Saphenofemoral Junction: No evidence of thrombus. Normal compressibility and flow on color Doppler imaging. Profunda Femoral Vein: No evidence of thrombus. Normal compressibility and flow on color Doppler imaging. Femoral Vein: No evidence of thrombus. Normal compressibility, respiratory phasicity and response to augmentation. Popliteal Vein: No evidence of thrombus. Normal compressibility, respiratory phasicity and response to augmentation. Calf Veins: No evidence of thrombus. Normal compressibility and flow on color Doppler imaging. IMPRESSION: No evidence of deep venous thrombosis. Electronically Signed   By: Jerilynn Mages.  Shick M.D.   On: 11/15/2021 17:24  ? ?DG Femur Min 2 Views Left ? ?Result Date: 11/15/2021 ?CLINICAL DATA:  Left thigh pain EXAM: LEFT FEMUR 2 VIEWS COMPARISON:  None. FINDINGS: Frontal and lateral views of the left femur are  obtained. No acute displaced fracture. Alignment is anatomic. Soft tissues are unremarkable. IMPRESSION: 1. Unremarkable left femur. Electronically Signed   By: Randa Ngo M.D.   On: 11/15/2021 17:26
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
## 2022-01-31 ENCOUNTER — Telehealth: Payer: Medicare Other | Admitting: Psychiatry

## 2022-02-12 NOTE — Progress Notes (Signed)
? ?Virtual Visit via Telephone Note ? ?I connected with Erica Webster on 02/15/22 at  8:00 AM EDT by telephone and verified that I am speaking with the correct person using two identifiers. ? ?Location: ?Patient: home ?Provider: office ?Persons participated in the visit- patient, provider  ?  ?I discussed the limitations, risks, security and privacy concerns of performing an evaluation and management service by telephone and the availability of in person appointments. I also discussed with the patient that there may be a patient responsible charge related to this service. The patient expressed understanding and agreed to proceed. ? ?  ?I discussed the assessment and treatment plan with the patient. The patient was provided an opportunity to ask questions and all were answered. The patient agreed with the plan and demonstrated an understanding of the instructions. ?  ?The patient was advised to call back or seek an in-person evaluation if the symptoms worsen or if the condition fails to improve as anticipated. ? ?I provided 14 minutes of non-face-to-face time during this encounter. ? ? ?[name], MD ? ? ?BH MD/PA/NP OP Progress Note ? ?02/15/2022 8:35 AM ?Erica Webster  ?MRN:  [id number] ? ?Chief Complaint:  ?Chief Complaint  ?Patient presents with  ? Follow-up  ? Depression  ? ?HPI:  ?This is a follow-up appointment for depression.  ?She states that she has been feeling sad.  The mother of her aunt passed away on 2023/02/19.  She was very close with her.  She has been trying to listen to music and taking a walk.  She feels hurt and shocked.  She reports good relationship with her father aunt.  She also reports concern about the nightmare she had last night.  Although she denies any re-experiencing of trauma, she was scared of the treating.  She also states that she will need to mood soon due to renovation.  She feels overwhelmed.  She has occasional insomnia.  She has decrease in appetite.  She denies SI.   She has occasional anxiety.  She denies AH, VH.  She takes Abilify regularly.  She feels comfortable to stay on the current dose at this time.  ? ?Daily routine: walking around neighborhood, vacuume flores, watch TV ?Support: maternal aunt, uncle , cousins ?Household: by herself ?Marital status: single ?Number of children:0  ?Employment: on disability due to depression ?Education: graduated from [employer], went to some college for 3-4 years (did not graduate due to depression, anxiety) ?Last PCP / ongoing medical evaluation:   ?She was born and raised in [place].  Her parents deceased several years ago.  Her father suffered from dementia, stroke and pneumonia; he was in his 47's.  Her brother died at age 65's from "weight issue" and diabetes. She moved to [facility] in 2016 since loss of her parents.  ? ? ?Visit Diagnosis:  ?  ICD-10-CM   ?1. MDD (major depressive disorder), recurrent episode, mild (HCC)  F33.0   ?  ? ? ?Past Psychiatric History: Please see initial evaluation for full details. I have reviewed the history. No updates at this time.  ?  ? ?Past Medical History:  ?Past Medical History:  ?Diagnosis Date  ? Anxiety   ? Asthma   ? Depression   ? Dyspnea   ? Sleep apnea   ?  ?Past Surgical History:  ?Procedure Laterality Date  ? CHOLECYSTECTOMY    ? COLONOSCOPY WITH PROPOFOL N/A 03/12/2021  ? Procedure: COLONOSCOPY WITH PROPOFOL;  Surgeon: [name], MD;  Location:  [facility] ENDOSCOPY;  Service: Endoscopy;  Laterality: N/A;  ? ESOPHAGOGASTRODUODENOSCOPY (EGD) WITH PROPOFOL N/A 03/12/2021  ? Procedure: ESOPHAGOGASTRODUODENOSCOPY (EGD) WITH PROPOFOL;  Surgeon: [name], MD;  Location: [facility] ENDOSCOPY;  Service: Endoscopy;  Laterality: N/A;  ? TOENAIL EXCISION    ? ? ?Family Psychiatric History: Please see initial evaluation for full details. I have reviewed the history. No updates at this time.  ?  ? ?Family History:  ?Family History  ?Adopted: Yes  ?Problem Relation Age of Onset  ? Breast cancer Neg Hx    ? ? ?Social History:  ?Social History  ? ?Socioeconomic History  ? Marital status: Single  ?  Spouse name: Not on file  ? Number of children: Not on file  ? Years of education: Not on file  ? Highest education level: Not on file  ?Occupational History  ? Not on file  ?Tobacco Use  ? Smoking status: Never  ? Smokeless tobacco: Never  ?Vaping Use  ? Vaping Use: Never used  ?Substance and Sexual Activity  ? Alcohol use: No  ? Drug use: No  ? Sexual activity: Not on file  ?Other Topics Concern  ? Not on file  ?Social History Narrative  ? Not on file  ? ?Social Determinants of Health  ? ?Financial Resource Strain: Not on file  ?Food Insecurity: Not on file  ?Transportation Needs: Not on file  ?Physical Activity: Not on file  ?Stress: Not on file  ?Social Connections: Not on file  ? ? ?Allergies:  ?Allergies  ?Allergen Reactions  ? Shellfish Allergy Anaphylaxis  ? Penicillins Itching  ? ? ?Metabolic Disorder Labs: ?No results found for: HGBA1C, MPG ?No results found for: PROLACTIN ?No results found for: CHOL, TRIG, HDL, CHOLHDL, VLDL, LDLCALC ?No results found for: TSH ? ?Therapeutic Level Labs: ?No results found for: LITHIUM ?No results found for: VALPROATE ?No components found for:  CBMZ ? ?Current Medications: ?Current Outpatient Medications  ?Medication Sig Dispense Refill  ? albuterol (PROVENTIL HFA;VENTOLIN HFA) 108 (90 Base) MCG/ACT inhaler Inhale 2 puffs into the lungs every 4 (four) hours as needed for wheezing or shortness of breath.    ? albuterol (PROVENTIL) (2.5 MG/3ML) 0.083% nebulizer solution Take 2.5 mg by nebulization every 4 (four) hours as needed.    ? ARIPiprazole (ABILIFY) 10 MG tablet Take 1 tablet (10 mg total) by mouth daily. 30 tablet 0  ? Cholecalciferol (VITAMIN D3) 50 MCG (2000 UT) TABS Take 2,000 Units by mouth daily.    ? cyclobenzaprine (FLEXERIL) 10 MG tablet Take 10 mg by mouth 2 (two) times daily as needed for muscle spasms.    ? FLOVENT HFA 110 MCG/ACT inhaler Inhale 2 puffs into the  lungs 2 (two) times daily.    ? fluticasone-salmeterol (ADVAIR) 250-50 MCG/ACT AEPB Inhale 1 puff into the lungs in the morning and at bedtime.    ? HYDROcodone-acetaminophen (NORCO/VICODIN) 5-325 MG tablet Take 1 tablet by mouth every 6 (six) hours as needed for moderate pain.    ? ibuprofen (ADVIL) 800 MG tablet Take 1 tablet (800 mg total) by mouth every 8 (eight) hours as needed. 30 tablet 0  ? meloxicam (MOBIC) 15 MG tablet Take 1 tablet (15 mg total) by mouth daily. 30 tablet 0  ? montelukast (SINGULAIR) 10 MG tablet Take 1 tablet by mouth daily.    ? ondansetron (ZOFRAN) 4 MG tablet Take 4 mg by mouth every 8 (eight) hours as needed for nausea or vomiting.    ? Spacer/Aero Chamber  Mouthpiece MISC 1 Units by Does not apply route every 4 (four) hours as needed (wheezing). 1 each 0  ? vitamin B-12 (CYANOCOBALAMIN) 500 MCG tablet Take 500 mcg by mouth daily.    ? ?No current facility-administered medications for this visit.  ? ? ? ?Musculoskeletal: ?Strength & Muscle Tone:  N/A ?Gait & Station:  N/A ?Patient leans: N/A ? ?Psychiatric Specialty Exam: ?Review of Systems  ?Psychiatric/Behavioral:  Positive for dysphoric mood and sleep disturbance. Negative for agitation, behavioral problems, confusion, decreased concentration, hallucinations, self-injury and suicidal ideas. The patient is nervous/anxious. The patient is not hyperactive.   ?All other systems reviewed and are negative.  ?There were no vitals taken for this visit.There is no height or weight on file to calculate BMI.  ?General Appearance: NA  ?Eye Contact:  NA  ?Speech:  Clear and Coherent  ?Volume:  Normal  ?Mood:   sad  ?Affect:  NA  ?Thought Process:  Coherent  ?Orientation:  Full (Time, Place, and Person)  ?Thought Content: Logical   ?Suicidal Thoughts:  No  ?Homicidal Thoughts:  No  ?Memory:  Immediate;   Good  ?Judgement:  Good  ?Insight:  Present  ?Psychomotor Activity:  Normal  ?Concentration:  Concentration: Good and Attention Span: Good   ?Recall:  Good  ?Fund of Knowledge: Good  ?Language: Good  ?Akathisia:  No  ?Handed:  Right  ?AIMS (if indicated): not done  ?Assets:  Communication Skills ?Desire for Improvement  ?ADL's:  Intact  ?Cognition:

## 2022-02-15 ENCOUNTER — Encounter: Payer: Self-pay | Admitting: Psychiatry

## 2022-02-15 ENCOUNTER — Telehealth (INDEPENDENT_AMBULATORY_CARE_PROVIDER_SITE_OTHER): Payer: Medicare Other | Admitting: Psychiatry

## 2022-02-15 DIAGNOSIS — F330 Major depressive disorder, recurrent, mild: Secondary | ICD-10-CM

## 2022-02-15 MED ORDER — ARIPIPRAZOLE 10 MG PO TABS: 10.0000 mg | ORAL_TABLET | Freq: Every day | ORAL | 0 refills | Status: DC

## 2022-02-15 NOTE — Patient Instructions (Addendum)
Continue Abilify 10 mg daily  ?Next appointment: 6/15 at 4:30, in person ? ? ?The next visit will be in person visit. Please arrive 15 mins before the scheduled time.  ? ?[facility]  ?Address: [address], [place], [place] [address]   ?

## 2022-03-17 ENCOUNTER — Encounter: Payer: Self-pay | Admitting: Psychiatry

## 2022-03-17 ENCOUNTER — Ambulatory Visit (INDEPENDENT_AMBULATORY_CARE_PROVIDER_SITE_OTHER): Payer: Medicare Other | Admitting: Psychiatry

## 2022-03-17 VITALS — BP 159/108 | HR 80 | Temp 98.3°F | Ht 62.0 in | Wt 342.0 lb

## 2022-03-17 DIAGNOSIS — F331 Major depressive disorder, recurrent, moderate: Secondary | ICD-10-CM

## 2022-03-17 MED ORDER — ARIPIPRAZOLE 10 MG PO TABS
10.0000 mg | ORAL_TABLET | Freq: Every day | ORAL | 1 refills | Status: DC
Start: 2022-03-17 — End: 2022-05-16

## 2022-03-17 NOTE — Patient Instructions (Signed)
Continue Abilify 10 mg daily Next appointment: 8/10 at 11:30, in person
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
## 2022-04-07 ENCOUNTER — Emergency Department: Payer: Medicare Other

## 2022-04-07 ENCOUNTER — Emergency Department
Admission: EM | Admit: 2022-04-07 | Discharge: 2022-04-07 | Disposition: A | Discharge status: Home / Self Care | Payer: Medicare Other | Attending: Emergency Medicine | Admitting: Emergency Medicine

## 2022-04-07 ENCOUNTER — Other Ambulatory Visit: Payer: Self-pay

## 2022-04-07 DIAGNOSIS — J4541 Moderate persistent asthma with (acute) exacerbation: Secondary | ICD-10-CM | POA: Insufficient documentation

## 2022-04-07 DIAGNOSIS — Z20822 Contact with and (suspected) exposure to covid-19: Secondary | ICD-10-CM | POA: Diagnosis not present

## 2022-04-07 DIAGNOSIS — J45901 Unspecified asthma with (acute) exacerbation: Secondary | ICD-10-CM

## 2022-04-07 DIAGNOSIS — R0602 Shortness of breath: Secondary | ICD-10-CM | POA: Diagnosis present

## 2022-04-07 LAB — CBC WITH DIFFERENTIAL/PLATELET
Abs Immature Granulocytes: 0.05 10*3/uL (ref 0.00–0.07)
Basophils Absolute: 0.1 10*3/uL (ref 0.0–0.1)
Basophils Relative: 1 %
Eosinophils Absolute: 0.1 10*3/uL (ref 0.0–0.5)
Eosinophils Relative: 1 %
HCT: 36.4 % (ref 36.0–46.0)
Hemoglobin: 10.3 g/dL — ABNORMAL LOW (ref 12.0–15.0)
Immature Granulocytes: 1 %
Lymphocytes Relative: 28 %
Lymphs Abs: 2.3 10*3/uL (ref 0.7–4.0)
MCH: 22.8 pg — ABNORMAL LOW (ref 26.0–34.0)
MCHC: 28.3 g/dL — ABNORMAL LOW (ref 30.0–36.0)
MCV: 80.5 fL (ref 80.0–100.0)
Monocytes Absolute: 0.4 10*3/uL (ref 0.1–1.0)
Monocytes Relative: 5 %
Neutro Abs: 5.2 10*3/uL (ref 1.7–7.7)
Neutrophils Relative %: 64 %
Platelets: 347 10*3/uL (ref 150–400)
RBC: 4.52 MIL/uL (ref 3.87–5.11)
RDW: 17.1 % — ABNORMAL HIGH (ref 11.5–15.5)
WBC: 8.1 10*3/uL (ref 4.0–10.5)
nRBC: 0 % (ref 0.0–0.2)

## 2022-04-07 LAB — BASIC METABOLIC PANEL
Anion gap: 8 (ref 5–15)
BUN: 19 mg/dL (ref 6–20)
CO2: 28 mmol/L (ref 22–32)
Calcium: 8.6 mg/dL — ABNORMAL LOW (ref 8.9–10.3)
Chloride: 105 mmol/L (ref 98–111)
Creatinine, Ser: 1.04 mg/dL — ABNORMAL HIGH (ref 0.44–1.00)
GFR, Estimated: >60 mL/min (ref >60)
Glucose, Bld: 86 mg/dL (ref 70–99)
Potassium: 3.5 mmol/L (ref 3.5–5.1)
Sodium: 141 mmol/L (ref 135–145)

## 2022-04-07 LAB — TROPONIN I (HIGH SENSITIVITY): Troponin I (High Sensitivity): 5 ng/L (ref <18)

## 2022-04-07 LAB — RESP PANEL BY RT-PCR (FLU A&B, COVID) ARPGX2
Influenza A by PCR: NEGATIVE
Influenza B by PCR: NEGATIVE
SARS Coronavirus 2 by RT PCR: NEGATIVE

## 2022-04-07 LAB — D-DIMER, QUANTITATIVE: D-Dimer, Quant: 0.46 ug/mL-FEU (ref 0.00–0.50)

## 2022-04-07 MED ORDER — IPRATROPIUM-ALBUTEROL 0.5-2.5 (3) MG/3ML IN SOLN
3.0000 mL | Freq: Once | RESPIRATORY_TRACT | Status: AC
  Administered 2022-04-07: 3 mL via RESPIRATORY_TRACT
  Filled 2022-04-07: qty 3

## 2022-04-07 NOTE — Discharge Instructions (Signed)
Your blood work, x-ray were normal.  Please return for any new, worsening, or change in symptoms or other concerns.  It was a pleasure caring for you today.

## 2022-04-07 NOTE — ED Notes (Signed)
Pt to ED for increased asthma symptoms since 3 weeks ago. States was wheezing and coughing las night. No wheezes heard by this RN but breath sounds do sound slightly coarse and pt does have a dry cough. Respirations are unlabored with no accessory muscle use or nasal flaring noted. Pt states has been using inhalers and nebulizers at home and was prescribed prednisone by PCP 2-3 days ago but still feels bad.

## 2022-04-07 NOTE — ED Triage Notes (Signed)
Pt states she has been having issues with here asthma flaring up for the past 3 weeks, pt is currently on prednisone, pt is in NAD, ambulatory with a steady gait, respirations WNL, skin is warm and dry

## 2022-04-07 NOTE — ED Provider Notes (Signed)
[facility] Provider Note    Event Date/Time   First MD Initiated Contact with Patient 04/07/22 1023     (approximate)   History   Asthma   HPI  Erica Webster is a 36 y.o. female with a past medical history of morbid obesity, asthma who presents today for evaluation of shortness of breath and chest tightness.  She reports that her symptoms have been ongoing for the last 3 days.  She reports that the heat normally exacerbates her asthma. She reports that she has not noticed any leg swelling.  She reports that she has chest tightness.  She denies history of PE/DVT.  She has been using her nebulizers without improvement of her symptoms.  Patient Active Problem List   Diagnosis Date Noted   OSA (obstructive sleep apnea) 06/14/2021   Moderate persistent asthma 06/14/2021   OSA on CPAP 02/15/2021   CPAP use counseling 02/15/2021   Morbid obesity (HCC) 02/15/2021   Status post laparoscopic cholecystectomy 01/26/2021   Severe obesity (BMI >= 40) (HCC) 12/10/2020          Physical Exam   Triage Vital Signs: ED Triage Vitals  Enc Vitals Group     BP 04/07/22 1008 134/88     Pulse Rate 04/07/22 1008 (!) 108     Resp 04/07/22 1008 18     Temp 04/07/22 1008 98.6 F (37 C)     Temp Source 04/07/22 1008 Oral     SpO2 04/07/22 1008 96 %     Weight 04/07/22 1009 (!) 341 lb 14.9 oz (155.1 kg)     Height 04/07/22 1009 5\' 2"  (1.575 m)     Head Circumference --      Peak Flow --      Pain Score --      Pain Loc --      Pain Edu? --      Excl. in GC? --     Most recent vital signs: Vitals:   04/07/22 1250 04/07/22 1400  BP: 130/90 138/88  Pulse: 100 88  Resp: 18 18  Temp:    SpO2: 96% 97%    Physical Exam Vitals and nursing note reviewed.  Constitutional:      General: Awake and alert. No acute distress.    Appearance: Normal appearance. The patient is morbidly obese HENT:     Head: Normocephalic and atraumatic.     Mouth: Mucous membranes  are moist.  Eyes:     General: PERRL. Normal EOMs        Right eye: No discharge.        Left eye: No discharge.     Conjunctiva/sclera: Conjunctivae normal.  Cardiovascular:     Rate and Rhythm: Normal rate and regular rhythm.     Pulses: Normal pulses.     Heart sounds: Normal heart sounds Pulmonary:     Effort: Pulmonary effort is normal. No respiratory distress.     Breath sounds: Normal breath sounds.  Mild diffuse expiratory wheezes.  Able to speak easily in complete sentences Abdominal:     Abdomen is soft. There is no abdominal tenderness. No rebound or guarding. No distention. Musculoskeletal:        General: No swelling. Normal range of motion.     Cervical back: Normal range of motion and neck supple.  Skin:    General: Skin is warm and dry.     Capillary Refill: Capillary refill takes less than 2 seconds.  Findings: No rash.  Neurological:     Mental Status: The patient is awake and alert.      ED Results / Procedures / Treatments   Labs (all labs ordered are listed, but only abnormal results are displayed) Labs Reviewed  CBC WITH DIFFERENTIAL/PLATELET - Abnormal; Notable for the following components:      Result Value   Hemoglobin 10.3 (*)    MCH 22.8 (*)    MCHC 28.3 (*)    RDW 17.1 (*)    All other components within normal limits  BASIC METABOLIC PANEL - Abnormal; Notable for the following components:   Creatinine, Ser 1.04 (*)    Calcium 8.6 (*)    All other components within normal limits  RESP PANEL BY RT-PCR (FLU A&B, COVID) ARPGX2  D-DIMER, QUANTITATIVE  TROPONIN I (HIGH SENSITIVITY)  TROPONIN I (HIGH SENSITIVITY)     EKG     RADIOLOGY I independently reviewed and interpreted imaging and agree with radiologists findings.    PROCEDURES:  Critical Care performed:   Procedures   MEDICATIONS ORDERED IN ED: Medications  ipratropium-albuterol (DUONEB) 0.5-2.5 (3) MG/3ML nebulizer solution 3 mL (3 mLs Nebulization Given 04/07/22 1235)      IMPRESSION / MDM / ASSESSMENT AND PLAN / ED COURSE  I reviewed the triage vital signs and the nursing notes.   Differential diagnosis includes, but is not limited to, asthma exacerbation, pneumonia, bronchitis, pulmonary embolism.  Patient is awake and alert, mildly tachycardic to 108 though normotensive and with a normal oxygen saturation of 96% on room air.  She is able to speak in complete sentences.  X-ray of her chest is without focal airspace opacity.  Labs are overall reassuring with negative troponin and negative D-dimer.  She was treated with a DuoNeb with significant improvement of her symptoms.  Upon reevaluation, she reports that she feels back to normal.  She feels that this was exactly same as her previous asthma exacerbations.  She reports that she does not need any refills of her inhalers or nebulizer.  We discussed return precautions and the importance of close outpatient follow-up.  Patient understands and agrees with plan.  She was discharged in stable condition.  Patient's presentation is most consistent with severe exacerbation of chronic illness.       FINAL CLINICAL IMPRESSION(S) / ED DIAGNOSES   Final diagnoses:  Mild asthma with exacerbation, unspecified whether persistent     Rx / DC Orders   ED Discharge Orders     None        Note:  This document was prepared using Dragon voice recognition software and may include unintentional dictation errors.   Keturah Shavers 04/07/22 1511    Sharman Cheek, MD 04/10/22 0000

## 2022-04-07 NOTE — ED Notes (Signed)
Pt will need u/s IV for bloodwork. Attempted one time, got blood return but unable to advance catheter. Pt has large amt of adipose tissue. Provider informed need u/s IV.
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
## 2022-05-12 ENCOUNTER — Ambulatory Visit: Payer: Medicare Other | Admitting: Psychiatry
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
## 2022-05-16 ENCOUNTER — Encounter: Payer: Self-pay | Admitting: Psychiatry

## 2022-05-16 ENCOUNTER — Ambulatory Visit (INDEPENDENT_AMBULATORY_CARE_PROVIDER_SITE_OTHER): Payer: Medicare Other | Admitting: Psychiatry

## 2022-05-16 VITALS — BP 129/84 | HR 58 | Temp 98.5°F | Wt 346.8 lb

## 2022-05-16 DIAGNOSIS — F330 Major depressive disorder, recurrent, mild: Secondary | ICD-10-CM | POA: Diagnosis not present

## 2022-05-16 MED ORDER — ARIPIPRAZOLE 10 MG PO TABS: 10.0000 mg | ORAL_TABLET | Freq: Every day | ORAL | 1 refills | Status: DC

## 2022-05-16 NOTE — Patient Instructions (Signed)
Continue Abilify 10 mg daily  Next appointment: 10/9 at 2:30

## 2022-05-26 NOTE — Progress Notes (Unsigned)
[facility] [place], [place] [address]  Pulmonary Sleep Medicine   Office Visit Note  Patient Name: Erica Webster DOB: 07/07/1986 MRN [id number]    Chief Complaint: Obstructive Sleep Apnea visit  Brief History:  [name] is seen today for a 4 month follow up on CPAP at 14 cmh20.  The patient has a 1 year 5 month history of sleep apnea. Patient is using PAP nightly.  The patient feels feels not rested after sleeping with PAP.  The patient reports it is hard to tell if she benefits from PAP use. Reported sleepiness is about the same and the Epworth Sleepiness Score is 9 out of 24. The patient sometimes take naps, about 3-4 times for about an hour and a half. The patient complains of the following: Trouble breathing with the PAP while on medications.  The patient has difficulty breathing when first applying the PAP at night and she feels better after wearing it for some time. The compliance download shows 73% compliance with an average use time of 8 hours 22 minutes. The AHI is 0.1.  The patient does not complain of limb movements disrupting sleep.  ROS  General: (-) fever, (-) chills, (-) night sweat Nose and Sinuses: (-) nasal stuffiness or itchiness, (-) postnasal drip, (-) nosebleeds, (-) sinus trouble. Mouth and Throat: (-) sore throat, (-) hoarseness. Neck: (-) swollen glands, (-) enlarged thyroid, (-) neck pain. Respiratory: +cough, - shortness of breath, +wheezing. Neurologic: - numbness, - tingling. Psychiatric: - anxiety, - depression   Current Medication: Outpatient Encounter Medications as of 05/30/2022  Medication Sig   albuterol (PROVENTIL HFA;VENTOLIN HFA) 108 (90 Base) MCG/ACT inhaler Inhale 2 puffs into the lungs every 4 (four) hours as needed for wheezing or shortness of breath.   albuterol (PROVENTIL) (2.5 MG/3ML) 0.083% nebulizer solution Take 2.5 mg by nebulization every 4 (four) hours as needed.   ARIPiprazole (ABILIFY) 10 MG tablet  Take 1 tablet (10 mg total) by mouth daily.   Cholecalciferol (VITAMIN D3) 50 MCG (2000 UT) TABS Take 2,000 Units by mouth daily.   FLOVENT HFA 110 MCG/ACT inhaler Inhale 2 puffs into the lungs 2 (two) times daily.   fluticasone (FLONASE) 50 MCG/ACT nasal spray Place 2 sprays into both nostrils daily.   fluticasone-salmeterol (ADVAIR) 250-50 MCG/ACT AEPB Inhale 1 puff into the lungs in the morning and at bedtime.   montelukast (SINGULAIR) 10 MG tablet Take 1 tablet by mouth daily.   ondansetron (ZOFRAN) 4 MG tablet Take 4 mg by mouth every 8 (eight) hours as needed for nausea or vomiting.   Spacer/Aero Chamber Mouthpiece MISC 1 Units by Does not apply route every 4 (four) hours as needed (wheezing).   vitamin B-12 (CYANOCOBALAMIN) 500 MCG tablet Take 500 mcg by mouth daily.   No facility-administered encounter medications on file as of 05/30/2022.    Surgical History: Past Surgical History:  Procedure Laterality Date   CHOLECYSTECTOMY     COLONOSCOPY WITH PROPOFOL N/A 03/12/2021   Procedure: COLONOSCOPY WITH PROPOFOL;  Surgeon: Lesly Rubenstein, MD;  Location: [facility] ENDOSCOPY;  Service: Endoscopy;  Laterality: N/A;   ESOPHAGOGASTRODUODENOSCOPY (EGD) WITH PROPOFOL N/A 03/12/2021   Procedure: ESOPHAGOGASTRODUODENOSCOPY (EGD) WITH PROPOFOL;  Surgeon: Lesly Rubenstein, MD;  Location: [facility] ENDOSCOPY;  Service: Endoscopy;  Laterality: N/A;   TOENAIL EXCISION      Medical History: Past Medical History:  Diagnosis Date   Anxiety    Asthma    Depression    Dyspnea    Sleep  apnea     Family History: Non contributory to the present illness  Social History: Social History   Socioeconomic History   Marital status: Single    Spouse name: Not on file   Number of children: Not on file   Years of education: Not on file   Highest education level: Not on file  Occupational History   Not on file  Tobacco Use   Smoking status: Never   Smokeless tobacco: Never  Vaping Use   Vaping  Use: Never used  Substance and Sexual Activity   Alcohol use: No   Drug use: No   Sexual activity: Not Currently  Other Topics Concern   Not on file  Social History Narrative   Not on file   Social Determinants of Health   Financial Resource Strain: Not on file  Food Insecurity: Not on file  Transportation Needs: Not on file  Physical Activity: Not on file  Stress: Not on file  Social Connections: Not on file  Intimate Partner Violence: Not on file    Vital Signs: Blood pressure (!) 139/96, pulse 88, resp. rate 14, height 5\' 2"  (1.575 m), weight (!) 337 lb (152.9 kg), SpO2 95 %. Body mass index is 61.64 kg/m.    Examination: General Appearance: The patient is well-developed, well-nourished, and in no distress. Neck Circumference: 39 cm Skin: Gross inspection of skin unremarkable. Head: normocephalic, no gross deformities. Eyes: no gross deformities noted. ENT: ears appear grossly normal Neurologic: Alert and oriented. No involuntary movements.  STOP BANG RISK ASSESSMENT S (snore) Have you been told that you snore?     NO   T (tired) Are you often tired, fatigued, or sleepy during the day?   YES  O (obstruction) Do you stop breathing, choke, or gasp during sleep? YES   P (pressure) Do you have or are you being treated for high blood pressure? NO   B (BMI) Is your body index greater than 35 kg/m? YES   A (age) Are you 35 years old or older? NO   N (neck) Do you have a neck circumference greater than 16 inches?   YES   G (gender) Are you a female? NO   TOTAL STOP/BANG "YES" ANSWERS 4       A STOP-Bang score of 2 or less is considered low risk, and a score of 5 or more is high risk for having either moderate or severe OSA. For people who score 3 or 4, doctors may need to perform further assessment to determine how likely they are to have OSA.         EPWORTH SLEEPINESS SCALE:  Scale:  (0)= no chance of dozing; (1)= slight chance of dozing; (2)= moderate chance  of dozing; (3)= high chance of dozing  Chance  Situtation    Sitting and reading: 0    Watching TV: 1    Sitting Inactive in public: 1    As a passenger in car: 3      Lying down to rest: 3    Sitting and talking: 0    Sitting quielty after lunch: 0    In a car, stopped in traffic: 1   TOTAL SCORE:   9 out of 24    SLEEP STUDIES:  SPLIT (10/01/20)  AHI 49.8, REM AHI 102.9, SPO2 62%, CPAP AT 13 CMH20 TITRATION (06/22/21)  CPAP AT 14 CMH20   CPAP COMPLIANCE DATA:  Date Range: 04/26/22 - 05/25/22  Average Daily Use: 5 hours 22  minutes  Median Use: 5 hours 37 minutes  Compliance for > 4 Hours: 22 days  AHI: 0.1 respiratory events per hour  Days Used: 27/30  Mask Leak: 3.8  95th Percentile Pressure: 14 cmh20         LABS: Recent Results (from the past 2160 hour(s))  CBC with Differential     Status: Abnormal   Collection Time: 04/07/22 10:46 AM  Result Value Ref Range   WBC 8.1 4.0 - 10.5 K/uL   RBC 4.52 3.87 - 5.11 MIL/uL   Hemoglobin 10.3 (L) 12.0 - 15.0 g/dL   HCT 93.2 35.5 - 73.2 %   MCV 80.5 80.0 - 100.0 fL   MCH 22.8 (L) 26.0 - 34.0 pg   MCHC 28.3 (L) 30.0 - 36.0 g/dL   RDW 20.2 (H) 54.2 - 70.6 %   Platelets 347 150 - 400 K/uL   nRBC 0.0 0.0 - 0.2 %   Neutrophils Relative % 64 %   Neutro Abs 5.2 1.7 - 7.7 K/uL   Lymphocytes Relative 28 %   Lymphs Abs 2.3 0.7 - 4.0 K/uL   Monocytes Relative 5 %   Monocytes Absolute 0.4 0.1 - 1.0 K/uL   Eosinophils Relative 1 %   Eosinophils Absolute 0.1 0.0 - 0.5 K/uL   Basophils Relative 1 %   Basophils Absolute 0.1 0.0 - 0.1 K/uL   Immature Granulocytes 1 %   Abs Immature Granulocytes 0.05 0.00 - 0.07 K/uL    Comment: Performed at [facility], [address] [place], [place] [address]  D-dimer, quantitative     Status: None   Collection Time: 04/07/22 10:46 AM  Result Value Ref Range   D-Dimer, Quant 0.46 0.00 - 0.50 ug/mL-FEU    Comment: (NOTE) At the manufacturer cut-off value of  0.5 g/mL FEU, this assay has a negative predictive value of 95-100%.This assay is intended for use in conjunction with a clinical pretest probability (PTP) assessment model to exclude pulmonary embolism (PE) and deep venous thrombosis (DVT) in outpatients suspected of PE or DVT. Results should be correlated with clinical presentation. Performed at [facility], [address] [place], [place] [address]   Basic metabolic panel     Status: Abnormal   Collection Time: 04/07/22 10:46 AM  Result Value Ref Range   Sodium 141 135 - 145 mmol/L   Potassium 3.5 3.5 - 5.1 mmol/L   Chloride 105 98 - 111 mmol/L   CO2 28 22 - 32 mmol/L   Glucose, Bld 86 70 - 99 mg/dL    Comment: Glucose reference range applies only to samples taken after fasting for at least 8 hours.   BUN 19 6 - 20 mg/dL   Creatinine, Ser 7.61 (H) 0.44 - 1.00 mg/dL   Calcium 8.6 (L) 8.9 - 10.3 mg/dL   GFR, Estimated >60 >73 mL/min    Comment: (NOTE) Calculated using the CKD-EPI Creatinine Equation (2021)    Anion gap 8 5 - 15    Comment: Performed at [facility], [address] [place], [place] [address]  Troponin I (High Sensitivity)     Status: None   Collection Time: 04/07/22 10:46 AM  Result Value Ref Range   Troponin I (High Sensitivity) 5 <18 ng/L    Comment: (NOTE) Elevated high sensitivity troponin I (hsTnI) values and significant  changes across serial measurements may suggest ACS but many other  chronic and acute conditions are known to elevate hsTnI results.  Refer to the "Links" section  for chest pain algorithms and additional  guidance. Performed at [facility], [address] [place], [place] [address]   Resp Panel by RT-PCR (Flu A&B, Covid) Anterior Nasal Swab     Status: None   Collection Time: 04/07/22 10:46 AM   Specimen: Anterior Nasal Swab  Result Value Ref Range   SARS Coronavirus 2 by RT PCR NEGATIVE NEGATIVE    Comment: (NOTE) SARS-CoV-2 target nucleic acids  are NOT DETECTED.  The SARS-CoV-2 RNA is generally detectable in upper respiratory specimens during the acute phase of infection. The lowest concentration of SARS-CoV-2 viral copies this assay can detect is 138 copies/mL. A negative result does not preclude SARS-Cov-2 infection and should not be used as the sole basis for treatment or other patient management decisions. A negative result may occur with  improper specimen collection/handling, submission of specimen other than nasopharyngeal swab, presence of viral mutation(s) within the areas targeted by this assay, and inadequate number of viral copies(<138 copies/mL). A negative result must be combined with clinical observations, patient history, and epidemiological information. The expected result is Negative.  Fact Sheet for Patients:  EntrepreneurPulse.com.au  Fact Sheet for Healthcare Providers:  IncredibleEmployment.be  This test is no t yet approved or cleared by the [place] FDA and  has been authorized for detection and/or diagnosis of SARS-CoV-2 by FDA under an Emergency Use Authorization (EUA). This EUA will remain  in effect (meaning this test can be used) for the duration of the COVID-19 declaration under Section 564(b)(1) of the Act, 21 U.S.C.section 360bbb-3(b)(1), unless the authorization is terminated  or revoked sooner.       Influenza A by PCR NEGATIVE NEGATIVE   Influenza B by PCR NEGATIVE NEGATIVE    Comment: (NOTE) The Xpert Xpress SARS-CoV-2/FLU/RSV plus assay is intended as an aid in the diagnosis of influenza from Nasopharyngeal swab specimens and should not be used as a sole basis for treatment. Nasal washings and aspirates are unacceptable for Xpert Xpress SARS-CoV-2/FLU/RSV testing.  Fact Sheet for Patients: EntrepreneurPulse.com.au  Fact Sheet for Healthcare Providers: IncredibleEmployment.be  This test is not yet  approved or cleared by the [place] FDA and has been authorized for detection and/or diagnosis of SARS-CoV-2 by FDA under an Emergency Use Authorization (EUA). This EUA will remain in effect (meaning this test can be used) for the duration of the COVID-19 declaration under Section 564(b)(1) of the Act, 21 U.S.C. section 360bbb-3(b)(1), unless the authorization is terminated or revoked.  Performed at [facility], [address], [place], [place] [address]     Radiology: DG Chest 2 View  Result Date: 04/07/2022 CLINICAL DATA:  A 36 year old female presents for evaluation of shortness of breath, history of asthma by report. EXAM: CHEST - 2 VIEW COMPARISON:  December 05, 2019. FINDINGS: Cardiomediastinal contours and hilar structures are normal. Lungs are clear. No sign of pleural effusion. No pulmonary edema. No pneumothorax. On limited assessment there is no acute skeletal process. IMPRESSION: No acute cardiopulmonary disease. Electronically Signed   By: Zetta Bills M.D.   On: 04/07/2022 11:42    No results found.  No results found.    Assessment and Plan: Patient Active Problem List   Diagnosis Date Noted   OSA (obstructive sleep apnea) 06/14/2021   Moderate persistent asthma 06/14/2021   OSA on CPAP 02/15/2021   CPAP use counseling 02/15/2021   Morbid obesity ([place]) 02/15/2021   Status post laparoscopic cholecystectomy 01/26/2021   Severe obesity (BMI >= 40) ([place]) 12/10/2020  1. OSA on CPAP The patient does tolerate PAP and reports  benefit from PAP use. She is using her cpap much more consistently. She will discuss her cough and wheezing with her primary care doctor to discuss changes in her asthma treatment. The patient was reminded how to clean equipment and advised to replace supplies routinely. The patient was also counselled on weight loss. The compliance is fair. The AHI is 0.2.   OSA on cpap- CPAP continues to be medically necessary to treat this  patient's OSA.  Continue with cpap- increase use.   2. CPAP use counseling CPAP Counseling: had a lengthy discussion with the patient regarding the importance of PAP therapy in management of the sleep apnea. Patient appears to understand the risk factor reduction and also understands the risks associated with untreated sleep apnea. Patient will try to make a good faith effort to remain compliant with therapy. Also instructed the patient on proper cleaning of the device including the water must be changed daily if possible and use of distilled water is preferred. Patient understands that the machine should be regularly cleaned with appropriate recommended cleaning solutions that do not damage the PAP machine for example given white vinegar and water rinses. Other methods such as ozone treatment may not be as good as these simple methods to achieve cleaning.   3. Morbid obesity (HCC) Obesity Counseling: Had a lengthy discussion regarding patients BMI and weight issues. Patient was instructed on portion control as well as increased activity. Also discussed caloric restrictions with trying to maintain intake less than 2000 Kcal. Discussions were made in accordance with the 5As of weight management. Simple actions such as not eating late and if able to, taking a walk is suggested.      General Counseling: I have discussed the findings of the evaluation and examination with G[place].  I have also discussed any further diagnostic evaluation thatmay be needed or ordered today. Windie verbalizes understanding of the findings of todays visit. We also reviewed her medications today and discussed drug interactions and side effects including but not limited excessive drowsiness and altered mental states. We also discussed that there is always a risk not just to her but also people around her. she has been encouraged to call the office with any questions or concerns that should arise related to todays visit.  No  orders of the defined types were placed in this encounter.       I have personally obtained a history, examined the patient, evaluated laboratory and imaging results, formulated the assessment and plan and placed orders. This patient was seen today by Emmaline Kluver, PA-C in collaboration with Dr. Freda Munro.   Yevonne Pax, MD [facility] Diplomate ABMS Pulmonary Critical Care Medicine and Sleep Medicine

## 2022-05-30 ENCOUNTER — Ambulatory Visit (INDEPENDENT_AMBULATORY_CARE_PROVIDER_SITE_OTHER): Payer: Medicare Other | Admitting: Internal Medicine

## 2022-05-30 VITALS — BP 139/96 | HR 88 | Resp 14 | Ht 62.0 in | Wt 337.0 lb

## 2022-05-30 DIAGNOSIS — Z7189 Other specified counseling: Secondary | ICD-10-CM

## 2022-05-30 DIAGNOSIS — G4733 Obstructive sleep apnea (adult) (pediatric): Secondary | ICD-10-CM

## 2022-05-30 DIAGNOSIS — Z9989 Dependence on other enabling machines and devices: Secondary | ICD-10-CM | POA: Diagnosis not present

## 2022-05-30 NOTE — Patient Instructions (Signed)
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
## 2022-07-11 ENCOUNTER — Ambulatory Visit (INDEPENDENT_AMBULATORY_CARE_PROVIDER_SITE_OTHER): Payer: Medicare Other | Admitting: Psychiatry

## 2022-07-11 ENCOUNTER — Encounter: Payer: Self-pay | Admitting: Psychiatry

## 2022-07-11 VITALS — BP 127/77 | HR 102 | Temp 97.5°F | Ht 62.0 in | Wt 348.0 lb

## 2022-07-11 DIAGNOSIS — F331 Major depressive disorder, recurrent, moderate: Secondary | ICD-10-CM

## 2022-07-11 MED ORDER — ARIPIPRAZOLE 10 MG PO TABS: 10.0000 mg | ORAL_TABLET | Freq: Every day | ORAL | 2 refills | Status: DC

## 2022-07-11 NOTE — Addendum Note (Signed)
Addended by: Norman Clay on: 07/11/2022 03:36 PM   Modules accepted: Level of Service

## 2022-07-11 NOTE — Patient Instructions (Signed)
Continue Abilify 10 mg daily  Next appointment: 12/18 at 11:30

## 2022-07-18 ENCOUNTER — Ambulatory Visit (INDEPENDENT_AMBULATORY_CARE_PROVIDER_SITE_OTHER): Payer: Medicare Other | Admitting: Student in an Organized Health Care Education/Training Program

## 2022-07-18 ENCOUNTER — Encounter: Payer: Self-pay | Admitting: Student in an Organized Health Care Education/Training Program

## 2022-07-18 VITALS — BP 124/76 | HR 100 | Temp 98.3°F | Ht 62.0 in | Wt 343.8 lb

## 2022-07-18 DIAGNOSIS — J4540 Moderate persistent asthma, uncomplicated: Secondary | ICD-10-CM

## 2022-07-18 DIAGNOSIS — R0602 Shortness of breath: Secondary | ICD-10-CM

## 2022-07-18 LAB — NITRIC OXIDE: Nitric Oxide: 28

## 2022-07-18 MED ORDER — BUDESONIDE-FORMOTEROL FUMARATE 160-4.5 MCG/ACT IN AERO: 2.0000 | INHALATION_SPRAY | Freq: Two times a day (BID) | RESPIRATORY_TRACT | 12 refills | Status: DC

## 2022-07-18 MED ORDER — ALBUTEROL SULFATE HFA 108 (90 BASE) MCG/ACT IN AERS: 2.0000 | INHALATION_SPRAY | Freq: Four times a day (QID) | RESPIRATORY_TRACT | 11 refills | Status: DC | PRN

## 2022-07-18 NOTE — Progress Notes (Signed)
Synopsis: Referred in for shortness of breath and asthma by Brynda Greathouse, FNP  Assessment & Plan:   #Moderate Persistent Asthma  Presents with wheezing, cough, and shortness of breath consistent with a diagnosis of asthma that she carries. FENO today was borderline at 28. Her physical exam today is unremarkable, aside from morbid obesity. She had been on Flovent which I will step up to Symbicort to be used twice daily and as needed. Counseling on use of inhaler provided. I will obtain a pulmonary function test pre and post bronchodilators.  - Nitric oxide - budesonide-formoterol (SYMBICORT) 160-4.5 MCG/ACT inhaler; Inhale 2 puffs into the lungs 2 (two) times daily.  Dispense: 2 each; Refill: 12 - albuterol (VENTOLIN HFA) 108 (90 Base) MCG/ACT inhaler; Inhale 2 puffs into the lungs every 6 (six) hours as needed for wheezing or shortness of breath.  Dispense: 8 g; Refill: 11 - Pulmonary Function Test [facility] Only; Future - Weight loss recommended   Return in about 3 months (around 10/18/2022).  I spent 60 minutes caring for this patient today, including preparing to see the patient, obtaining and/or reviewing separately obtained history, performing a medically appropriate examination and/or evaluation, counseling and educating the patient/family/caregiver, ordering medications, tests, or procedures, and documenting clinical information in the electronic health record  Armando Reichert, MD [facility] 07/18/2022 5:43 PM    End of visit medications:  Meds ordered this encounter  Medications   budesonide-formoterol (SYMBICORT) 160-4.5 MCG/ACT inhaler    Sig: Inhale 2 puffs into the lungs 2 (two) times daily.    Dispense:  2 each    Refill:  12   albuterol (VENTOLIN HFA) 108 (90 Base) MCG/ACT inhaler    Sig: Inhale 2 puffs into the lungs every 6 (six) hours as needed for wheezing or shortness of breath.    Dispense:  8 g    Refill:  11     Current Outpatient  Medications:    albuterol (PROVENTIL HFA;VENTOLIN HFA) 108 (90 Base) MCG/ACT inhaler, Inhale 2 puffs into the lungs every 4 (four) hours as needed for wheezing or shortness of breath., Disp: , Rfl:    albuterol (PROVENTIL) (2.5 MG/3ML) 0.083% nebulizer solution, Take 2.5 mg by nebulization every 4 (four) hours as needed., Disp: , Rfl:    albuterol (VENTOLIN HFA) 108 (90 Base) MCG/ACT inhaler, Inhale 2 puffs into the lungs every 6 (six) hours as needed for wheezing or shortness of breath., Disp: 8 g, Rfl: 11   ARIPiprazole (ABILIFY) 10 MG tablet, Take 1 tablet (10 mg total) by mouth daily., Disp: 30 tablet, Rfl: 2   budesonide-formoterol (SYMBICORT) 160-4.5 MCG/ACT inhaler, Inhale 2 puffs into the lungs 2 (two) times daily., Disp: 2 each, Rfl: 12   Cholecalciferol (VITAMIN D3) 50 MCG (2000 UT) TABS, Take 2,000 Units by mouth daily., Disp: , Rfl:    fluticasone (FLONASE) 50 MCG/ACT nasal spray, Place 2 sprays into both nostrils daily., Disp: , Rfl:    montelukast (SINGULAIR) 10 MG tablet, Take 1 tablet by mouth daily., Disp: , Rfl:    ondansetron (ZOFRAN) 4 MG tablet, Take 4 mg by mouth every 8 (eight) hours as needed for nausea or vomiting., Disp: , Rfl:    Spacer/Aero Chamber Mouthpiece MISC, 1 Units by Does not apply route every 4 (four) hours as needed (wheezing)., Disp: 1 each, Rfl: 0   vitamin B-12 (CYANOCOBALAMIN) 500 MCG tablet, Take 500 mcg by mouth daily., Disp: , Rfl:    Subjective:   PATIENT ID:  Erica Webster GENDER: female DOB: 04-27-1986, MRN: [id number]  Chief Complaint  Patient presents with   pulmonary consult    Hx of asthma-- SOB with exertion and wheezing.     HPI  Erica Webster is a pleasant 36 year old female presenting to clinic for the evaluation of shortness of breath and wheezing.  She reports that she's been diagnosed with asthma in the past, and was given inhalers for it. She has been having an increasing burden of symptoms recently, with increased wheezing and  shortness of breath. She had been given an inhaler with which she is compliant. She reports an intermittent cough, not productive of sputum, and denies hemoptysis. She has had no fevers, chills, night sweats, weight loss, or other signs of systemic inflammation. She reports that her weight has been stable (gain a few pounds, loose a few pounds). She has not been exposed to any body ill recently. She recently moved to a new apartment and reports no change in symptoms with the move. She does report a weird smell in the apartment which improved with scents. She has not had any recent prednisone. She was prescribed Flovent for the asthma in addition to Montelukast. She is a non-smoker and is on disability. She is compliant with her CPAP.  Ancillary information including prior medications, full medical/surgical/family/social histories, and PFTs (when available) are listed below and have been reviewed.   Review of Systems  Constitutional:  Negative for chills, fever and weight loss.  Respiratory:  Positive for cough, shortness of breath and wheezing.   Cardiovascular:  Negative for chest pain.     Objective:   Vitals:   07/18/22 1139  BP: 124/76  Pulse: 100  Temp: 98.3 F (36.8 C)  TempSrc: Temporal  SpO2: 97%  Weight: (!) 343 lb 12.8 oz (155.9 kg)  Height: 5\' 2"  (1.575 m)   97% on RA  BMI Readings from Last 3 Encounters:  07/18/22 62.88 kg/m  07/11/22 63.65 kg/m  05/30/22 61.64 kg/m   Wt Readings from Last 3 Encounters:  07/18/22 (!) 343 lb 12.8 oz (155.9 kg)  07/11/22 (!) 348 lb (157.9 kg)  05/30/22 (!) 337 lb (152.9 kg)    Physical Exam Constitutional:      Appearance: She is obese. She is not ill-appearing.  HENT:     Mouth/Throat:     Mouth: Mucous membranes are moist.  Cardiovascular:     Pulses: Normal pulses.     Heart sounds: Normal heart sounds.  Pulmonary:     Effort: Pulmonary effort is normal.     Breath sounds: Normal breath sounds. No wheezing, rhonchi or  rales.  Abdominal:     General: There is distension.  Skin:    General: Skin is warm.  Neurological:     General: No focal deficit present.     Mental Status: She is alert and oriented to person, place, and time.       Ancillary Information    Past Medical History:  Diagnosis Date   Anxiety    Asthma    Depression    Dyspnea    Sleep apnea      Family History  Adopted: Yes  Problem Relation Age of Onset   Breast cancer Neg Hx      Past Surgical History:  Procedure Laterality Date   CHOLECYSTECTOMY     COLONOSCOPY WITH PROPOFOL N/A 03/12/2021   Procedure: COLONOSCOPY WITH PROPOFOL;  Surgeon: Lesly Rubenstein, MD;  Location: [facility] ENDOSCOPY;  Service: Endoscopy;  Laterality: N/A;   ESOPHAGOGASTRODUODENOSCOPY (EGD) WITH PROPOFOL N/A 03/12/2021   Procedure: ESOPHAGOGASTRODUODENOSCOPY (EGD) WITH PROPOFOL;  Surgeon: Lesly Rubenstein, MD;  Location: [facility] ENDOSCOPY;  Service: Endoscopy;  Laterality: N/A;   TOENAIL EXCISION      Social History   Socioeconomic History   Marital status: Single    Spouse name: Not on file   Number of children: Not on file   Years of education: Not on file   Highest education level: Not on file  Occupational History   Not on file  Tobacco Use   Smoking status: Never   Smokeless tobacco: Never  Vaping Use   Vaping Use: Never used  Substance and Sexual Activity   Alcohol use: No   Drug use: No   Sexual activity: Not Currently  Other Topics Concern   Not on file  Social History Narrative   Not on file   Social Determinants of Health   Financial Resource Strain: Not on file  Food Insecurity: Not on file  Transportation Needs: Not on file  Physical Activity: Not on file  Stress: Not on file  Social Connections: Not on file  Intimate Partner Violence: Not on file     Allergies  Allergen Reactions   Shellfish Allergy Anaphylaxis   Penicillins Itching     CBC    Component Value Date/Time   WBC 8.1 04/07/2022 1046    RBC 4.52 04/07/2022 1046   HGB 10.3 (L) 04/07/2022 1046   HCT 36.4 04/07/2022 1046   PLT 347 04/07/2022 1046   MCV 80.5 04/07/2022 1046   MCH 22.8 (L) 04/07/2022 1046   MCHC 28.3 (L) 04/07/2022 1046   RDW 17.1 (H) 04/07/2022 1046   LYMPHSABS 2.3 04/07/2022 1046   MONOABS 0.4 04/07/2022 1046   EOSABS 0.1 04/07/2022 1046   BASOSABS 0.1 04/07/2022 1046    Pulmonary Functions Testing Results:     No data to display          Outpatient Medications Prior to Visit  Medication Sig Dispense Refill   albuterol (PROVENTIL HFA;VENTOLIN HFA) 108 (90 Base) MCG/ACT inhaler Inhale 2 puffs into the lungs every 4 (four) hours as needed for wheezing or shortness of breath.     albuterol (PROVENTIL) (2.5 MG/3ML) 0.083% nebulizer solution Take 2.5 mg by nebulization every 4 (four) hours as needed.     ARIPiprazole (ABILIFY) 10 MG tablet Take 1 tablet (10 mg total) by mouth daily. 30 tablet 2   Cholecalciferol (VITAMIN D3) 50 MCG (2000 UT) TABS Take 2,000 Units by mouth daily.     fluticasone (FLONASE) 50 MCG/ACT nasal spray Place 2 sprays into both nostrils daily.     montelukast (SINGULAIR) 10 MG tablet Take 1 tablet by mouth daily.     ondansetron (ZOFRAN) 4 MG tablet Take 4 mg by mouth every 8 (eight) hours as needed for nausea or vomiting.     Spacer/Aero Chamber Mouthpiece MISC 1 Units by Does not apply route every 4 (four) hours as needed (wheezing). 1 each 0   vitamin B-12 (CYANOCOBALAMIN) 500 MCG tablet Take 500 mcg by mouth daily.     FLOVENT HFA 110 MCG/ACT inhaler Inhale 2 puffs into the lungs 2 (two) times daily.     fluticasone-salmeterol (ADVAIR) 250-50 MCG/ACT AEPB Inhale 1 puff into the lungs in the morning and at bedtime.     No facility-administered medications prior to visit.

## 2022-07-18 NOTE — Patient Instructions (Signed)
Today, I have ordered pulmonary function tests to assess your breathing. I have also changed your inhaler regimen. You will be using Symbicort (2 puffs twice daily IN ADDITION to 2 puffs every 6 hours as needed) for your inhaler. Continue using the Singulair as before. I will see you in 3 months for follow up.
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
## 2022-09-19 ENCOUNTER — Ambulatory Visit (INDEPENDENT_AMBULATORY_CARE_PROVIDER_SITE_OTHER): Payer: Medicare Other | Admitting: Psychiatry

## 2022-09-19 ENCOUNTER — Encounter: Payer: Self-pay | Admitting: Psychiatry

## 2022-09-19 VITALS — BP 140/92 | HR 102 | Temp 97.8°F | Ht 62.0 in | Wt 341.0 lb

## 2022-09-19 DIAGNOSIS — F331 Major depressive disorder, recurrent, moderate: Secondary | ICD-10-CM

## 2022-09-19 MED ORDER — ARIPIPRAZOLE 10 MG PO TABS: 10.0000 mg | ORAL_TABLET | Freq: Every day | ORAL | 2 refills | Status: DC

## 2022-09-19 NOTE — Patient Instructions (Signed)
Continue Abilify 10 mg daily Next appointment: 3/21 at 11 AM

## 2022-12-13 ENCOUNTER — Emergency Department: Payer: Medicare Other

## 2022-12-13 ENCOUNTER — Emergency Department
Admission: EM | Admit: 2022-12-13 | Discharge: 2022-12-13 | Disposition: A | Discharge status: Home / Self Care | Payer: Medicare Other | Attending: Emergency Medicine | Admitting: Emergency Medicine

## 2022-12-13 DIAGNOSIS — R0602 Shortness of breath: Secondary | ICD-10-CM

## 2022-12-13 DIAGNOSIS — J4521 Mild intermittent asthma with (acute) exacerbation: Secondary | ICD-10-CM | POA: Diagnosis not present

## 2022-12-13 DIAGNOSIS — J4540 Moderate persistent asthma, uncomplicated: Secondary | ICD-10-CM

## 2022-12-13 MED ORDER — BUDESONIDE-FORMOTEROL FUMARATE 160-4.5 MCG/ACT IN AERO: 2.0000 | INHALATION_SPRAY | Freq: Two times a day (BID) | RESPIRATORY_TRACT | 1 refills | Status: DC

## 2022-12-13 MED ORDER — IPRATROPIUM-ALBUTEROL 0.5-2.5 (3) MG/3ML IN SOLN
3.0000 mL | Freq: Once | RESPIRATORY_TRACT | Status: AC
  Administered 2022-12-13: 3 mL via RESPIRATORY_TRACT
  Filled 2022-12-13: qty 3

## 2022-12-13 NOTE — ED Notes (Signed)
First Nurse Note: Pt to ED via [facility] from home for trouble breathing. Pt states that she smelled marijuana at 4 am and started having trouble breathing after that. Pt states that she has used has her inhaler without relief. Pt has hx/o Asthma. Per EMS lung sounds are clear and VSS. Pt is in NAD at this time.
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
## 2022-12-13 NOTE — Discharge Instructions (Addendum)
Your exam and x-ray are normal and reassuring this time.  Take your inhalers and nebulizers as prescribed.  Follow-up with your primary provider or return to the ED if needed.

## 2022-12-13 NOTE — ED Provider Notes (Signed)
[facility] Emergency Department Provider Note     Event Date/Time   First MD Initiated Contact with Patient 12/13/22 1820     (approximate)   History   Shortness of Breath   HPI  Erica Webster is a 37 y.o. female with a history of OSA, morbid obesity, and asthma, presents to the ED for feeling shortness of breath.  She reports symptoms started about 4 AM when she smelled marijuana through the window.  Patient reports using her rescue inhaler with some limited benefit.  She presents to the ED still endorsing some shortness of breath but she denies any fevers, chills, sweats patient denies any chest pain, cough, hemoptysis.   Physical Exam   Triage Vital Signs: ED Triage Vitals  Enc Vitals Group     BP 12/13/22 1704 (!) 150/85     Pulse Rate 12/13/22 1704 91     Resp 12/13/22 1704 18     Temp 12/13/22 1704 98.5 F (36.9 C)     Temp Source 12/13/22 1704 Oral     SpO2 12/13/22 1704 100 %     Weight 12/13/22 1702 (!) 341 lb 0.8 oz (154.7 kg)     Height 12/13/22 1702 '5\' 2"'$  (1.575 m)     Head Circumference --      Peak Flow --      Pain Score 12/13/22 1702 0     Pain Loc --      Pain Edu? --      Excl. in [place]? --     Most recent vital signs: Vitals:   12/13/22 1704  BP: (!) 150/85  Pulse: 91  Resp: 18  Temp: 98.5 F (36.9 C)  SpO2: 100%    General Awake, no distress. NAD HEENT NCAT. PERRL. EOMI. No rhinorrhea. Mucous membranes are moist.  CV:  Good peripheral perfusion.  RESP:  Normal effort.  ABD:  No distention.    ED Results / Procedures / Treatments   Labs (all labs ordered are listed, but only abnormal results are displayed) Labs Reviewed - No data to display   EKG  Vent. rate 81 BPM PR interval 168 ms QRS duration 66 ms QT/QTcB 370/429 ms P-R-T axes 51 26 25 Normal sinus rhythm Low voltage QRS Nonspecific T wave abnormality Abnormal ECG When compared with ECG of 07-Apr-2022 11:05,  RADIOLOGY  I personally  viewed and evaluated these images as part of my medical decision making, as well as reviewing the written report by the radiologist.  ED Provider Interpretation: no acute findings  DG CXR 2 V  IMPRESSION: No active cardiopulmonary disease.   Electronically Signed By: Yvonne Kendall M.D. On: 12/13/2022 18:13    PROCEDURES:  Critical Care performed: No  Procedures   MEDICATIONS ORDERED IN ED: Medications  ipratropium-albuterol (DUONEB) 0.5-2.5 (3) MG/3ML nebulizer solution 3 mL (3 mLs Nebulization Given 12/13/22 1916)     IMPRESSION / MDM / ASSESSMENT AND PLAN / ED COURSE  I reviewed the triage vital signs and the nursing notes.                              Differential diagnosis includes, but is not limited to, viral syndrome, bronchitis including COPD exacerbation, pneumonia, reactive airway disease including asthma, CHF including exacerbation with or without pulmonary/interstitial edema, pneumothorax, ACS, thoracic trauma, and pulmonary embolism.   Patient's presentation is most consistent with acute complicated illness / injury requiring diagnostic workup.  Patient's diagnosis is consistent with asthma exacerbation. Patient will be discharged home with prescriptions for Symbicort refills. Patient is to follow up with a provider as needed or otherwise directed. Patient is given ED precautions to return to the ED for any worsening or new symptoms.   FINAL CLINICAL IMPRESSION(S) / ED DIAGNOSES   Final diagnoses:  Mild intermittent asthma with exacerbation     Rx / DC Orders   ED Discharge Orders          Ordered    budesonide-formoterol (SYMBICORT) 160-4.5 MCG/ACT inhaler  2 times daily        12/13/22 1942             Note:  This document was prepared using Dragon voice recognition software and may include unintentional dictation errors.    Melvenia Needles, PA-C 12/13/22 1946    Duffy Bruce, MD 12/15/22 [phone]
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
## 2022-12-22 ENCOUNTER — Encounter: Payer: Self-pay | Admitting: Psychiatry

## 2022-12-22 ENCOUNTER — Ambulatory Visit (INDEPENDENT_AMBULATORY_CARE_PROVIDER_SITE_OTHER): Payer: Medicare Other | Admitting: Psychiatry

## 2022-12-22 VITALS — BP 136/84 | HR 98 | Temp 97.8°F | Ht 62.0 in | Wt 334.4 lb

## 2022-12-22 DIAGNOSIS — F330 Major depressive disorder, recurrent, mild: Secondary | ICD-10-CM | POA: Diagnosis not present

## 2022-12-22 MED ORDER — ARIPIPRAZOLE 10 MG PO TABS: 10.0000 mg | ORAL_TABLET | Freq: Every day | ORAL | 1 refills | Status: DC

## 2023-01-16 ENCOUNTER — Ambulatory Visit (INDEPENDENT_AMBULATORY_CARE_PROVIDER_SITE_OTHER): Payer: Medicare Other | Admitting: Internal Medicine

## 2023-01-16 VITALS — BP 155/99 | HR 83 | Resp 16 | Ht 62.0 in | Wt 352.0 lb

## 2023-01-16 DIAGNOSIS — G4733 Obstructive sleep apnea (adult) (pediatric): Secondary | ICD-10-CM

## 2023-01-16 DIAGNOSIS — R030 Elevated blood-pressure reading, without diagnosis of hypertension: Secondary | ICD-10-CM | POA: Insufficient documentation

## 2023-01-16 DIAGNOSIS — Z7189 Other specified counseling: Secondary | ICD-10-CM | POA: Diagnosis not present

## 2023-01-16 NOTE — Progress Notes (Unsigned)
[facility] [address] [place], [place] [address]  Pulmonary Sleep Medicine   Office Visit Note  Patient Name: Erica Webster DOB: May 16, 1986 MRN [id number]    Chief Complaint: Obstructive Sleep Apnea visit  Brief History:  Erica Webster is seen today for an annual follow up on CPAP at 14 cmh20. The patient has a 2 year history of sleep apnea. Patient is not using PAP nightly.  The patient feels not rested after sleeping with PAP.  The patient reports sometimes benefits from PAP use. Reported sleepiness is not improved and the Epworth Sleepiness Score is 13 out of 24. The patient does not take naps. The patient complains of the following: She struggles with the mask.  The compliance download shows 53% compliance with an average use time of 4 hours. The AHI is 0.1.  The patient does not complain of limb movements disrupting sleep.  ROS  General: (-) fever, (-) chills, (-) night sweat Nose and Sinuses: (-) nasal stuffiness or itchiness, (-) postnasal drip, (-) nosebleeds, (-) sinus trouble. Mouth and Throat: (-) sore throat, (-) hoarseness. Neck: (-) swollen glands, (-) enlarged thyroid, (-) neck pain. Respiratory: + cough, + shortness of breath, + wheezing. Neurologic: - numbness, - tingling. Psychiatric: +anxiety, + depression   Current Medication: Outpatient Encounter Medications as of 01/16/2023  Medication Sig   albuterol (PROVENTIL HFA;VENTOLIN HFA) 108 (90 Base) MCG/ACT inhaler Inhale 2 puffs into the lungs every 4 (four) hours as needed for wheezing or shortness of breath.   albuterol (PROVENTIL) (2.5 MG/3ML) 0.083% nebulizer solution Take 2.5 mg by nebulization every 4 (four) hours as needed.   albuterol (VENTOLIN HFA) 108 (90 Base) MCG/ACT inhaler Inhale 2 puffs into the lungs every 6 (six) hours as needed for wheezing or shortness of breath.   ARIPiprazole (ABILIFY) 10 MG tablet Take 1 tablet (10 mg total) by mouth daily.   budesonide-formoterol (SYMBICORT)  160-4.5 MCG/ACT inhaler Inhale 2 puffs into the lungs 2 (two) times daily.   budesonide-formoterol (SYMBICORT) 160-4.5 MCG/ACT inhaler Inhale 2 puffs into the lungs 2 (two) times daily.   Cholecalciferol (VITAMIN D3) 50 MCG (2000 UT) TABS Take 2,000 Units by mouth daily.   fluticasone (FLONASE) 50 MCG/ACT nasal spray Place 2 sprays into both nostrils daily.   montelukast (SINGULAIR) 10 MG tablet Take 1 tablet by mouth daily.   ondansetron (ZOFRAN) 4 MG tablet Take 4 mg by mouth every 8 (eight) hours as needed for nausea or vomiting.   Spacer/Aero Chamber Mouthpiece MISC 1 Units by Does not apply route every 4 (four) hours as needed (wheezing).   vitamin B-12 (CYANOCOBALAMIN) 500 MCG tablet Take 500 mcg by mouth daily.   No facility-administered encounter medications on file as of 01/16/2023.    Surgical History: Past Surgical History:  Procedure Laterality Date   CHOLECYSTECTOMY     COLONOSCOPY WITH PROPOFOL N/A 03/12/2021   Procedure: COLONOSCOPY WITH PROPOFOL;  Surgeon: [name], MD;  Location: [facility] ENDOSCOPY;  Service: Endoscopy;  Laterality: N/A;   ESOPHAGOGASTRODUODENOSCOPY (EGD) WITH PROPOFOL N/A 03/12/2021   Procedure: ESOPHAGOGASTRODUODENOSCOPY (EGD) WITH PROPOFOL;  Surgeon: [name], MD;  Location: [facility] ENDOSCOPY;  Service: Endoscopy;  Laterality: N/A;   TOENAIL EXCISION      Medical History: Past Medical History:  Diagnosis Date   Anxiety    Asthma    Depression    Dyspnea    Sleep apnea     Family History: Non contributory to the present illness  Social History: Social History  Socioeconomic History   Marital status: Single    Spouse name: Not on file   Number of children: Not on file   Years of education: Not on file   Highest education level: Not on file  Occupational History   Not on file  Tobacco Use   Smoking status: Never   Smokeless tobacco: Never  Vaping Use   Vaping Use: Never used  Substance and Sexual Activity   Alcohol  use: No   Drug use: No   Sexual activity: Not Currently  Other Topics Concern   Not on file  Social History Narrative   Not on file   Social Determinants of Health   Financial Resource Strain: Not on file  Food Insecurity: Not on file  Transportation Needs: Not on file  Physical Activity: Not on file  Stress: Not on file  Social Connections: Not on file  Intimate Partner Violence: Not on file    Vital Signs: There were no vitals taken for this visit. There is no height or weight on file to calculate BMI.    Examination: General Appearance: The patient is well-developed, well-nourished, and in no distress. Neck Circumference: 39 cm Skin: Gross inspection of skin unremarkable. Head: normocephalic, no gross deformities. Eyes: no gross deformities noted. ENT: ears appear grossly normal Neurologic: Alert and oriented. No involuntary movements.  STOP BANG RISK ASSESSMENT S (snore) Have you been told that you snore?     NO   T (tired) Are you often tired, fatigued, or sleepy during the day?   YES  O (obstruction) Do you stop breathing, choke, or gasp during sleep? NO   P (pressure) Do you have or are you being treated for high blood pressure? NO   B (BMI) Is your body index greater than 35 kg/m? YES   A (age) Are you 30 years old or older? NO   N (neck) Do you have a neck circumference greater than 16 inches?   NO   G (gender) Are you a female? NO   TOTAL STOP/BANG "YES" ANSWERS 2       A STOP-Bang score of 2 or less is considered low risk, and a score of 5 or more is high risk for having either moderate or severe OSA. For people who score 3 or 4, doctors may need to perform further assessment to determine how likely they are to have OSA.         EPWORTH SLEEPINESS SCALE:  Scale:  (0)= no chance of dozing; (1)= slight chance of dozing; (2)= moderate chance of dozing; (3)= high chance of dozing  Chance  Situtation    Sitting and reading: 1    Watching TV: 2     Sitting Inactive in public: 3    As a passenger in car: 2      Lying down to rest: 3    Sitting and talking: 0    Sitting quielty after lunch: 0    In a car, stopped in traffic: 2   TOTAL SCORE:   13 out of 24    SLEEP STUDIES:  SPLIT (10/01/20)  AHI 49.8, REM AHI 102.9, min SPO2 62%   CPAP COMPLIANCE DATA:  Date Range: 01/12/22 - 01/11/23  Average Daily Use: 4 hours  Median Use: 5 hours 4 minutes  Compliance for > 4 Hours: 192 days  AHI: 0.1 respiratory events per hour  Days Used: 274/365  Mask Leak: 5.8  95th Percentile Pressure: 14 cmh20  LABS: No results found for this or any previous visit (from the past 2160 hour(s)).  Radiology: DG Chest 2 View  Result Date: 12/13/2022 CLINICAL DATA:  Shortness of breath. EXAM: CHEST - 2 VIEW COMPARISON:  None Available. FINDINGS: The heart size and mediastinal contours are within normal limits. Both lungs are clear. The visualized skeletal structures are unremarkable. IMPRESSION: No active cardiopulmonary disease. Electronically Signed   By: Neita Garnet M.D.   On: 12/13/2022 18:13    No results found.  No results found.    Assessment and Plan: Patient Active Problem List   Diagnosis Date Noted   OSA (obstructive sleep apnea) 06/14/2021   Moderate persistent asthma 06/14/2021   OSA on CPAP 02/15/2021   CPAP use counseling 02/15/2021   Morbid obesity 02/15/2021   Status post laparoscopic cholecystectomy 01/26/2021   Severe obesity (BMI >= 40) 12/10/2020   1. OSA on CPAP The patient does tolerate PAP and reports  benefit from PAP use. The patient was reminded how to clean equipment and advised to replace supplies routinely. The patient was also counselled on weight loss. The compliance is poor. The AHI is 0.1.   OSA on cpap- controlled. Increase  compliance with pap. Detach tubing only when using the bathroom. CPAP continues to be medically necessary to treat this patient's OSA. F/u one  year.    2. CPAP use counseling CPAP Counseling: had a lengthy discussion with the patient regarding the importance of PAP therapy in management of the sleep apnea. Patient appears to understand the risk factor reduction and also understands the risks associated with untreated sleep apnea. Patient will try to make a good faith effort to remain compliant with therapy. Also instructed the patient on proper cleaning of the device including the water must be changed daily if possible and use of distilled water is preferred. Patient understands that the machine should be regularly cleaned with appropriate recommended cleaning solutions that do not damage the PAP machine for example given white vinegar and water rinses. Other methods such as ozone treatment may not be as good as these simple methods to achieve cleaning.   3. Morbid obesity Obesity Counseling: Had a lengthy discussion regarding patients BMI and weight issues. Patient was instructed on portion control as well as increased activity. Also discussed caloric restrictions with trying to maintain intake less than 2000 Kcal. Discussions were made in accordance with the 5As of weight management. Simple actions such as not eating late and if able to, taking a walk is suggested.   4. Elevated blood pressure reading in office without diagnosis of hypertension Counseled on using cpap more consistently. Schedule appt with pcp to discuss.  Hypertension Counseling:   The following hypertensive lifestyle modification were recommended and discussed:  1. Limiting alcohol intake to less than 1 oz/day of ethanol:(24 oz of beer or 8 oz of wine or 2 oz of 100-proof whiskey). 2. Take baby ASA 81 mg daily. 3. Importance of regular aerobic exercise and losing weight. 4. Reduce dietary saturated fat and cholesterol intake for overall cardiovascular health. 5. Maintaining adequate dietary potassium, calcium, and magnesium intake. 6. Regular monitoring of the blood  pressure. 7. Reduce sodium intake to less than 100 mmol/day (less than 2.3 gm of sodium or less than 6 gm of sodium choride)       General Counseling: I have discussed the findings of the evaluation and examination with Erica Webster.  I have also discussed any further diagnostic evaluation thatmay be needed or ordered today.  Erica Webster verbalizes understanding of the findings of todays visit. We also reviewed her medications today and discussed drug interactions and side effects including but not limited excessive drowsiness and altered mental states. We also discussed that there is always a risk not just to her but also people around her. she has been encouraged to call the office with any questions or concerns that should arise related to todays visit.  No orders of the defined types were placed in this encounter.       I have personally obtained a history, examined the patient, evaluated laboratory and imaging results, formulated the assessment and plan and placed orders. This patient was seen today by Emmaline Kluver, PA-C in collaboration with Dr. Freda Munro.   Yevonne Pax, MD [facility] Diplomate ABMS Pulmonary Critical Care Medicine and Sleep Medicine

## 2023-01-16 NOTE — Patient Instructions (Signed)

## 2023-01-17 ENCOUNTER — Ambulatory Visit: Payer: Medicare Other | Attending: Student in an Organized Health Care Education/Training Program

## 2023-01-17 DIAGNOSIS — J4540 Moderate persistent asthma, uncomplicated: Secondary | ICD-10-CM | POA: Diagnosis not present

## 2023-01-17 DIAGNOSIS — R0602 Shortness of breath: Secondary | ICD-10-CM | POA: Insufficient documentation

## 2023-01-17 LAB — PULMONARY FUNCTION TEST ARMC ONLY
DL/VA % pred: 132 %
DL/VA: 5.98 ml/min/mmHg/L
DLCO unc % pred: 89 %
DLCO unc: 18.32 ml/min/mmHg
FEF 25-75 Post: 2.18 L/sec
FEF 25-75 Pre: 1.86 L/sec
FEF2575-%Change-Post: 16 %
FEF2575-%Pred-Post: 71 %
FEF2575-%Pred-Pre: 61 %
FEV1-%Change-Post: 6 %
FEV1-%Pred-Post: 69 %
FEV1-%Pred-Pre: 65 %
FEV1-Post: 1.98 L
FEV1-Pre: 1.86 L
FEV1FVC-%Change-Post: 5 %
FEV1FVC-%Pred-Pre: 99 %
FEV6-%Change-Post: 1 %
FEV6-%Pred-Post: 67 %
FEV6-%Pred-Pre: 66 %
FEV6-Post: 2.29 L
FEV6-Pre: 2.27 L
FEV6FVC-%Pred-Post: 101 %
FEV6FVC-%Pred-Pre: 101 %
FVC-%Change-Post: 0 %
FVC-%Pred-Post: 66 %
FVC-%Pred-Pre: 65 %
FVC-Post: 2.29 L
FVC-Pre: 2.28 L
Post FEV1/FVC ratio: 87 %
Post FEV6/FVC ratio: 100 %
Pre FEV1/FVC ratio: 82 %
Pre FEV6/FVC Ratio: 100 %

## 2023-01-24 ENCOUNTER — Ambulatory Visit (INDEPENDENT_AMBULATORY_CARE_PROVIDER_SITE_OTHER): Payer: Medicare Other | Admitting: Student in an Organized Health Care Education/Training Program

## 2023-01-24 ENCOUNTER — Encounter: Payer: Self-pay | Admitting: Student in an Organized Health Care Education/Training Program

## 2023-01-24 ENCOUNTER — Other Ambulatory Visit
Admission: RE | Admit: 2023-01-24 | Discharge: 2023-01-24 | Disposition: A | Discharge status: Home / Self Care | Payer: Medicare Other | Source: Home / Self Care | Attending: Student in an Organized Health Care Education/Training Program | Admitting: Student in an Organized Health Care Education/Training Program

## 2023-01-24 ENCOUNTER — Ambulatory Visit
Admission: RE | Admit: 2023-01-24 | Discharge: 2023-01-24 | Disposition: A | Discharge status: Home / Self Care | Payer: Medicare Other | Source: Ambulatory Visit | Attending: Student in an Organized Health Care Education/Training Program | Admitting: Student in an Organized Health Care Education/Training Program

## 2023-01-24 VITALS — BP 126/78 | HR 74 | Temp 97.7°F | Ht 62.0 in | Wt 331.0 lb

## 2023-01-24 DIAGNOSIS — J4540 Moderate persistent asthma, uncomplicated: Secondary | ICD-10-CM

## 2023-01-24 DIAGNOSIS — R0602 Shortness of breath: Secondary | ICD-10-CM

## 2023-01-24 LAB — D-DIMER, QUANTITATIVE: D-Dimer, Quant: 0.51 ug/mL-FEU — ABNORMAL HIGH (ref 0.00–0.50)

## 2023-01-24 LAB — BRAIN NATRIURETIC PEPTIDE: B Natriuretic Peptide: 52.7 pg/mL (ref 0.0–100.0)

## 2023-01-24 MED ORDER — ALBUTEROL SULFATE (2.5 MG/3ML) 0.083% IN NEBU: 2.5000 mg | INHALATION_SOLUTION | RESPIRATORY_TRACT | 6 refills | Status: DC | PRN

## 2023-01-24 MED ORDER — IOHEXOL 350 MG/ML SOLN
100.0000 mL | Freq: Once | INTRAVENOUS | Status: AC | PRN
  Administered 2023-01-24: 125 mL via INTRAVENOUS

## 2023-01-24 NOTE — Addendum Note (Signed)
Addended byRaechel Chute on: 01/24/2023 02:48 PM   Modules accepted: Orders

## 2023-01-24 NOTE — Progress Notes (Signed)
Assessment & Plan:   1. Moderate persistent asthma, unspecified whether complicated   Patient presented initially for the workup and management of asthma, a diagnosis that she carries. Initial FENO was at 28 ppb. PFT's performed last week showed no obstructive defect, but was suggestive of restriction (no lung volumes were performed). ERV was notably low at 2% of predicted, which is consistent with her morbid obesity.  Overall I believe her shortness of breath is more secondary to obesity rather than obstructive lung disease, albeit given her diagnosis of asthma we will continue with Symbicort two puffs twice daily. Furthermore, I will obtain a d-dimer and BNP level today to rule out VTE and CHF.  Finally, I counseled the patient extensively on the importance of weight loss. We did discuss different foods that she could cut out to help with her endeavor, especially carbohydrates.  -continue Symbicort -check d-dimer today -weight loss encouraged  Return in about 1 year (around 01/24/2024).  I spent 30 minutes caring for this patient today, including preparing to see the patient, obtaining a medical history , reviewing a separately obtained history, performing a medically appropriate examination and/or evaluation, counseling and educating the patient/family/caregiver, ordering medications, tests, or procedures, and documenting clinical information in the electronic health record  Erica Chute, MD Erica Webster 01/24/2023 1:50 PM    End of visit medications:  No orders of the defined types were placed in this encounter.    Current Outpatient Medications:    albuterol (PROVENTIL HFA;VENTOLIN HFA) 108 (90 Base) MCG/ACT inhaler, Inhale 2 puffs into the lungs every 4 (four) hours as needed for wheezing or shortness of breath., Disp: , Rfl:    albuterol (PROVENTIL) (2.5 MG/3ML) 0.083% nebulizer solution, Take 2.5 mg by nebulization every 4 (four) hours as needed., Disp:  , Rfl:    ARIPiprazole (ABILIFY) 10 MG tablet, Take 1 tablet (10 mg total) by mouth daily., Disp: 30 tablet, Rfl: 1   budesonide-formoterol (SYMBICORT) 160-4.5 MCG/ACT inhaler, Inhale 2 puffs into the lungs 2 (two) times daily., Disp: 1 each, Rfl: 1   Cholecalciferol (VITAMIN D3) 50 MCG (2000 UT) TABS, Take 2,000 Units by mouth daily., Disp: , Rfl:    fluticasone (FLONASE) 50 MCG/ACT nasal spray, Place 2 sprays into both nostrils daily., Disp: , Rfl:    montelukast (SINGULAIR) 10 MG tablet, Take 1 tablet by mouth daily., Disp: , Rfl:    ondansetron (ZOFRAN) 4 MG tablet, Take 4 mg by mouth every 8 (eight) hours as needed for nausea or vomiting., Disp: , Rfl:    Spacer/Aero Chamber Mouthpiece MISC, 1 Units by Does not apply route every 4 (four) hours as needed (wheezing)., Disp: 1 each, Rfl: 0   vitamin B-12 (CYANOCOBALAMIN) 500 MCG tablet, Take 500 mcg by mouth daily., Disp: , Rfl:    Subjective:   PATIENT ID: Erica Webster GENDER: female DOB: [birth year], MRN: [id number]  Chief Complaint  Patient presents with   Follow-up    C/o SOB with exertion, wheezing and dry cough. Sx started this morning.     HPI  Erica Webster is a pleasant 37 year old female presenting to clinic for follow up of asthma.   She reports that she's been diagnosed with asthma in the past, and was given inhalers for it. She has been having an increasing burden of symptoms recently, with increased wheezing and shortness of breath. She reports an intermittent cough, not productive of sputum, and denies hemoptysis. She has had no fevers, chills, night sweats,  weight loss, or other signs of systemic inflammation. She reports that her weight has been stable (gain a few pounds, loose a few pounds).  Since our last visit, she started on Symbicort which she is compliant with. Patient did have a visit to the ED for shortness of breath, where she was seen and treated. She was discharged on the Symbicort, but no prednisone taper or  antibiotics were prescribed. Today, she reports that she felt shortness of breath in the morning, which did not respond to taking her inhalers. She did feel much better with a warm shower, however. She's been trying to loose weight, and we did discuss dietary modifications to help with this.  She recently moved to a new apartment and reports no change in symptoms with the move. She does report a weird smell in the apartment which improved with scents. She has not had any recent prednisone. She is a non-smoker and is on disability. She is compliant with her CPAP 50% of the time per the report from her sleep medicine provider, Dr. Welton Flakes.  Ancillary information including prior medications, full medical/surgical/family/social histories, and PFTs (when available) are listed below and have been reviewed.   Review of Systems  Constitutional:  Negative for chills, fever and weight loss.  Respiratory:  Positive for shortness of breath. Negative for cough and wheezing.   Cardiovascular:  Negative for chest pain.     Objective:   Vitals:   01/24/23 1338  BP: 126/78  Pulse: 74  Temp: 97.7 F (36.5 C)  TempSrc: Temporal  SpO2: 97%  Weight: (!) 331 lb (150.1 kg)  Height: 5\' 2"  (1.575 m)   97% on RA  BMI Readings from Last 3 Encounters:  01/24/23 60.54 kg/m  01/16/23 64.38 kg/m  12/22/22 61.16 kg/m   Wt Readings from Last 3 Encounters:  01/24/23 (!) 331 lb (150.1 kg)  01/16/23 (!) 352 lb (159.7 kg)  12/22/22 (!) 334 lb 6.4 oz (151.7 kg)    Physical Exam Constitutional:      Appearance: She is obese. She is not ill-appearing.  HENT:     Mouth/Throat:     Mouth: Mucous membranes are moist.  Cardiovascular:     Pulses: Normal pulses.     Heart sounds: Normal heart sounds.  Pulmonary:     Effort: Pulmonary effort is normal. No respiratory distress.     Breath sounds: Normal breath sounds. No wheezing, rhonchi or rales.  Abdominal:     General: There is distension.  Skin:     General: Skin is warm.  Neurological:     General: No focal deficit present.     Mental Status: She is alert and oriented to person, place, and time.     Ancillary Information    Past Medical History:  Diagnosis Date   Anxiety    Asthma    Depression    Dyspnea    Sleep apnea      Family History  Adopted: Yes  Problem Relation Age of Onset   Breast cancer Neg Hx      Past Surgical History:  Procedure Laterality Date   CHOLECYSTECTOMY     COLONOSCOPY WITH PROPOFOL N/A 03/12/2021   Procedure: COLONOSCOPY WITH PROPOFOL;  Surgeon: [name], MD;  Location: [facility];  Service: Endoscopy;  Laterality: N/A;   ESOPHAGOGASTRODUODENOSCOPY (EGD) WITH PROPOFOL N/A 03/12/2021   Procedure: ESOPHAGOGASTRODUODENOSCOPY (EGD) WITH PROPOFOL;  Surgeon: [name], MD;  Location: [facility] ENDOSCOPY;  Service: Endoscopy;  Laterality: N/A;  TOENAIL EXCISION      Social History   Socioeconomic History   Marital status: Single    Spouse name: Not on file   Number of children: Not on file   Years of education: Not on file   Highest education level: Not on file  Occupational History   Not on file  Tobacco Use   Smoking status: Never   Smokeless tobacco: Never  Vaping Use   Vaping Use: Never used  Substance and Sexual Activity   Alcohol use: No   Drug use: No   Sexual activity: Not Currently  Other Topics Concern   Not on file  Social History Narrative   Not on file   Social Determinants of Health   Financial Resource Strain: Not on file  Food Insecurity: Not on file  Transportation Needs: Not on file  Physical Activity: Not on file  Stress: Not on file  Social Connections: Not on file  Intimate Partner Violence: Not on file     Allergies  Allergen Reactions   Shellfish Allergy Anaphylaxis   Penicillins Itching     CBC    Component Value Date/Time   WBC 8.1 04/07/2022 1046   RBC 4.52 04/07/2022 1046   HGB 10.3 (L) 04/07/2022 1046   HCT 36.4  04/07/2022 1046   PLT 347 04/07/2022 1046   MCV 80.5 04/07/2022 1046   MCH 22.8 (L) 04/07/2022 1046   MCHC 28.3 (L) 04/07/2022 1046   RDW 17.1 (H) 04/07/2022 1046   LYMPHSABS 2.3 04/07/2022 1046   MONOABS 0.4 04/07/2022 1046   EOSABS 0.1 04/07/2022 1046   BASOSABS 0.1 04/07/2022 1046    Pulmonary Functions Testing Results:    Latest Ref Rng & Units 01/17/2023    3:27 PM  PFT Results  FVC-Pre L 2.28   FVC-Predicted Pre % 65   FVC-Post L 2.29   FVC-Predicted Post % 66   Pre FEV1/FVC % % 82   Post FEV1/FCV % % 87   FEV1-Pre L 1.86   FEV1-Predicted Pre % 65   FEV1-Post L 1.98   DLCO uncorrected ml/min/mmHg 18.32   DLCO UNC% % 89   DLVA Predicted % 132     Outpatient Medications Prior to Visit  Medication Sig Dispense Refill   albuterol (PROVENTIL HFA;VENTOLIN HFA) 108 (90 Base) MCG/ACT inhaler Inhale 2 puffs into the lungs every 4 (four) hours as needed for wheezing or shortness of breath.     albuterol (PROVENTIL) (2.5 MG/3ML) 0.083% nebulizer solution Take 2.5 mg by nebulization every 4 (four) hours as needed.     ARIPiprazole (ABILIFY) 10 MG tablet Take 1 tablet (10 mg total) by mouth daily. 30 tablet 1   budesonide-formoterol (SYMBICORT) 160-4.5 MCG/ACT inhaler Inhale 2 puffs into the lungs 2 (two) times daily. 1 each 1   Cholecalciferol (VITAMIN D3) 50 MCG (2000 UT) TABS Take 2,000 Units by mouth daily.     fluticasone (FLONASE) 50 MCG/ACT nasal spray Place 2 sprays into both nostrils daily.     montelukast (SINGULAIR) 10 MG tablet Take 1 tablet by mouth daily.     ondansetron (ZOFRAN) 4 MG tablet Take 4 mg by mouth every 8 (eight) hours as needed for nausea or vomiting.     Spacer/Aero Chamber Mouthpiece MISC 1 Units by Does not apply route every 4 (four) hours as needed (wheezing). 1 each 0   vitamin B-12 (CYANOCOBALAMIN) 500 MCG tablet Take 500 mcg by mouth daily.     No facility-administered medications prior to visit.

## 2023-01-27 ENCOUNTER — Telehealth: Payer: Self-pay

## 2023-01-27 ENCOUNTER — Other Ambulatory Visit (HOSPITAL_COMMUNITY): Payer: Self-pay

## 2023-01-27 NOTE — Telephone Encounter (Signed)
PA request received via pharmacy fax/CMM for albuterol (PROVENTIL) (2.5 MG/3ML) 0.083% nebulizer solution  PA not submitted due to test claim showing medication needs to be processed under Medicare Part B
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
## 2023-03-07 ENCOUNTER — Encounter: Payer: Self-pay | Admitting: Psychiatry

## 2023-03-07 ENCOUNTER — Ambulatory Visit (INDEPENDENT_AMBULATORY_CARE_PROVIDER_SITE_OTHER): Payer: Medicare (Managed Care) | Admitting: Psychiatry

## 2023-03-07 VITALS — BP 124/76 | HR 106 | Temp 98.4°F | Ht 62.0 in | Wt 330.4 lb

## 2023-03-07 DIAGNOSIS — F330 Major depressive disorder, recurrent, mild: Secondary | ICD-10-CM | POA: Diagnosis not present

## 2023-03-07 MED ORDER — ARIPIPRAZOLE 10 MG PO TABS
10.0000 mg | ORAL_TABLET | Freq: Every day | ORAL | 2 refills | Status: DC
Start: 2023-03-14 — End: 2023-05-24

## 2023-03-07 NOTE — Addendum Note (Signed)
Addended by: [name] on: 03/07/2023 12:23 PM   Modules accepted: Level of Service

## 2023-03-20 ENCOUNTER — Telehealth: Payer: Self-pay | Admitting: Student in an Organized Health Care Education/Training Program

## 2023-03-20 NOTE — Telephone Encounter (Signed)
I spoke with the patient.  She said she is waking up at night with SOB.  She said she does not have air conditioning in her bedroom and she thinks the heat is what is causing it. She does have AC in her living room and breaths fine when she is in there. She has called maintenance at her apartment complex and they brought out a fan but it still gets hot. She using her albuterol when she wakes up but 2 hours later it happens again.  She said she is going to be moved to another apartment but does not know when. She does use her CPAP at night.  Using- Albuterol- TID Symbicort- BID Proventil- Three times a week

## 2023-03-20 NOTE — Telephone Encounter (Signed)
Patient called to confirm that her transportation will be able to bring her to and from this appointment.

## 2023-03-20 NOTE — Telephone Encounter (Signed)
Per Dr. Jayme Cloud, she can see her today at 3:00pm or tomorrow at 1:30pm.  I spoke with the patient and offered her those 2 times. She said her transportation requires a 3 day notice. I scheduled her an appt for 03/23/23 at 9:30am. She will call back if this day does not work for her transportation.   Nothing further needed.

## 2023-03-20 NOTE — Telephone Encounter (Signed)
Pt states at night even with cpap she wakes up struggling to breathe. States air quality in apt is bad and inhalers aren't helping very much/.

## 2023-03-20 NOTE — Telephone Encounter (Signed)
Can she be seen for an acute visit today or tomorrow? Mostly to rule out an asthma exacerbation.

## 2023-03-23 ENCOUNTER — Encounter: Payer: Self-pay | Admitting: Student in an Organized Health Care Education/Training Program

## 2023-03-23 ENCOUNTER — Ambulatory Visit (INDEPENDENT_AMBULATORY_CARE_PROVIDER_SITE_OTHER): Payer: Medicare (Managed Care) | Admitting: Student in an Organized Health Care Education/Training Program

## 2023-03-23 VITALS — BP 128/78 | HR 71 | Temp 97.8°F | Ht 61.0 in | Wt 330.6 lb

## 2023-03-23 DIAGNOSIS — R0602 Shortness of breath: Secondary | ICD-10-CM

## 2023-03-23 DIAGNOSIS — J4520 Mild intermittent asthma, uncomplicated: Secondary | ICD-10-CM | POA: Diagnosis not present

## 2023-03-23 NOTE — Progress Notes (Signed)
Assessment & Plan:   #Mild Intermittent Asthma #Morbid Obesity #Shortness of Breath  Presenting for an acute visit with sudden onset shortness of breath last week. Followed in clinic for asthma, with PFT's showing only mild obstruction on a flow volume loop. CT/PE negative for clot, and BNP not suggestive of CHF during her prior visits.  On exam today, her lungs are clear and I am not concerned for an asthma exacerbation. She is already feeling well and back to normal. I have provided inhaler training and showed her the correct way of using Symbicort, including with a spacer device. I've also encouraged her to use the Symbicort twice daily as prescribed.  PFT's performed showed only a very minimal obstructive defect, and was suggestive of restriction (no lung volumes were performed). ERV was notably low at 2% of predicted, which is consistent with her morbid obesity.   Overall I believe her shortness of breath is more secondary to obesity rather than obstructive lung disease, albeit given her diagnosis of asthma we will continue with Symbicort two puffs twice daily. Finally, I counseled the patient extensively on the importance of weight loss.  -weight loss encouraged -continue symbicort, inhaler training provided  Return in about 6 months (around 09/22/2023).  I spent 30 minutes caring for this patient today, including preparing to see the patient, obtaining a medical history , reviewing a separately obtained history, performing a medically appropriate examination and/or evaluation, counseling and educating the patient/family/caregiver, and documenting clinical information in the electronic health record  Raechel Chute, MD [facility] 03/23/2023 9:48 AM    End of visit medications:  No orders of the defined types were placed in this encounter.    Current Outpatient Medications:    albuterol (PROVENTIL HFA;VENTOLIN HFA) 108 (90 Base) MCG/ACT inhaler, Inhale 2  puffs into the lungs every 4 (four) hours as needed for wheezing or shortness of breath., Disp: , Rfl:    albuterol (PROVENTIL) (2.5 MG/3ML) 0.083% nebulizer solution, Take 3 mLs (2.5 mg total) by nebulization every 4 (four) hours as needed., Disp: 75 mL, Rfl: 6   ARIPiprazole (ABILIFY) 10 MG tablet, Take 1 tablet (10 mg total) by mouth daily., Disp: 30 tablet, Rfl: 2   budesonide-formoterol (SYMBICORT) 160-4.5 MCG/ACT inhaler, Inhale 2 puffs into the lungs 2 (two) times daily., Disp: 1 each, Rfl: 1   Cholecalciferol (VITAMIN D3) 50 MCG (2000 UT) TABS, Take 2,000 Units by mouth daily., Disp: , Rfl:    fluticasone (FLONASE) 50 MCG/ACT nasal spray, Place 2 sprays into both nostrils daily., Disp: , Rfl:    montelukast (SINGULAIR) 10 MG tablet, Take 1 tablet by mouth daily., Disp: , Rfl:    ondansetron (ZOFRAN) 4 MG tablet, Take 4 mg by mouth every 8 (eight) hours as needed for nausea or vomiting., Disp: , Rfl:    Spacer/Aero Chamber Mouthpiece MISC, 1 Units by Does not apply route every 4 (four) hours as needed (wheezing)., Disp: 1 each, Rfl: 0   Subjective:   PATIENT ID: Erica Webster GENDER: female DOB: [birth year], MRN: [id number]  Chief Complaint  Patient presents with   Follow-up    C/o SOB at night. Bedroom is hot at night.     HPI  Erica Webster is a pleasant 37 year old female presenting to clinic for an acute visit.  She reports feeling shortness of breath overnight on Friday, waking her up from sleep. She used her inhalers and felt better afterwards. She is now back to normal, and doesn't  have any additional shortness of breath. Denies any cough, denies wheezing, and denies chest tightness. She denies sputum production, denies fevers, denies chills, denies night sweats, and denies hemoptysis. She's been trying to loose weight but this has been difficult for her.   During our prior visits, Symbicort was initiated. Patient did have a visit to the ED in March of 2023 for shortness of  breath, where she was seen and treated. She was discharged on the Symbicort, but no prednisone taper or antibiotics were prescribed. During her last visit with [place] a d-dimer was obtained which was positive, and a CT/PE protocol was negative for clot or for any abnormality. PFT's only showed mild obstruction, with a markedly reduced ERV. BNP was within normal.  She recently moved to a new apartment and feels this might be contributing to some of the symptoms. She does report a weird smell in the apartment which improved with scents. She has not had any recent prednisone. She is a non-smoker and is on disability. She is compliant with her CPAP 50% of the time per the report from her sleep medicine provider, Dr. Welton Flakes.   Ancillary information including prior medications, full medical/surgical/family/social histories, and PFTs (when available) are listed below and have been reviewed.   Review of Systems  Constitutional:  Negative for chills, fever and weight loss.  Respiratory:  Positive for shortness of breath. Negative for cough and wheezing.   Cardiovascular:  Negative for chest pain.     Objective:   Vitals:   03/23/23 0915  BP: 128/78  Pulse: 71  Temp: 97.8 F (36.6 C)  TempSrc: Temporal  SpO2: 98%  Weight: (!) 330 lb 9.6 oz (150 kg)  Height: 5\' 1"  (1.549 m)   98% on RA BMI Readings from Last 3 Encounters:  03/23/23 62.47 kg/m  03/07/23 60.43 kg/m  01/24/23 60.54 kg/m   Wt Readings from Last 3 Encounters:  03/23/23 (!) 330 lb 9.6 oz (150 kg)  03/07/23 (!) 330 lb 6.4 oz (149.9 kg)  01/24/23 (!) 331 lb (150.1 kg)    Physical Exam Constitutional:      Appearance: She is obese. She is not ill-appearing.  HENT:     Mouth/Throat:     Mouth: Mucous membranes are moist.  Cardiovascular:     Pulses: Normal pulses.     Heart sounds: Normal heart sounds.  Pulmonary:     Effort: Pulmonary effort is normal. No respiratory distress.     Breath sounds: Normal breath sounds. No  wheezing, rhonchi or rales.  Abdominal:     General: There is distension.  Skin:    General: Skin is warm.  Neurological:     General: No focal deficit present.     Mental Status: She is alert and oriented to person, place, and time.       Ancillary Information    Past Medical History:  Diagnosis Date   Anxiety    Asthma    Depression    Dyspnea    Sleep apnea      Family History  Adopted: Yes  Problem Relation Age of Onset   Breast cancer Neg Hx      Past Surgical History:  Procedure Laterality Date   CHOLECYSTECTOMY     COLONOSCOPY WITH PROPOFOL N/A 03/12/2021   Procedure: COLONOSCOPY WITH PROPOFOL;  Surgeon: [name], MD;  Location: [facility] ENDOSCOPY;  Service: Endoscopy;  Laterality: N/A;   ESOPHAGOGASTRODUODENOSCOPY (EGD) WITH PROPOFOL N/A 03/12/2021   Procedure: ESOPHAGOGASTRODUODENOSCOPY (EGD) WITH PROPOFOL;  Surgeon: [name], MD;  Location: [facility] ENDOSCOPY;  Service: Endoscopy;  Laterality: N/A;   TOENAIL EXCISION      Social History   Socioeconomic History   Marital status: Single    Spouse name: Not on file   Number of children: Not on file   Years of education: Not on file   Highest education level: Not on file  Occupational History   Not on file  Tobacco Use   Smoking status: Never   Smokeless tobacco: Never  Vaping Use   Vaping Use: Never used  Substance and Sexual Activity   Alcohol use: No   Drug use: No   Sexual activity: Not Currently  Other Topics Concern   Not on file  Social History Narrative   Not on file   Social Determinants of Health   Financial Resource Strain: Not on file  Food Insecurity: Not on file  Transportation Needs: Not on file  Physical Activity: Not on file  Stress: Not on file  Social Connections: Not on file  Intimate Partner Violence: Not on file     Allergies  Allergen Reactions   Shellfish Allergy Anaphylaxis   Penicillins Itching   Ferrous Sulfate Rash     CBC    Component  Value Date/Time   WBC 8.1 04/07/2022 1046   RBC 4.52 04/07/2022 1046   HGB 10.3 (L) 04/07/2022 1046   HCT 36.4 04/07/2022 1046   PLT 347 04/07/2022 1046   MCV 80.5 04/07/2022 1046   MCH 22.8 (L) 04/07/2022 1046   MCHC 28.3 (L) 04/07/2022 1046   RDW 17.1 (H) 04/07/2022 1046   LYMPHSABS 2.3 04/07/2022 1046   MONOABS 0.4 04/07/2022 1046   EOSABS 0.1 04/07/2022 1046   BASOSABS 0.1 04/07/2022 1046    Pulmonary Functions Testing Results:    Latest Ref Rng & Units 01/17/2023    3:27 PM  PFT Results  FVC-Pre L 2.28   FVC-Predicted Pre % 65   FVC-Post L 2.29   FVC-Predicted Post % 66   Pre FEV1/FVC % % 82   Post FEV1/FCV % % 87   FEV1-Pre L 1.86   FEV1-Predicted Pre % 65   FEV1-Post L 1.98   DLCO uncorrected ml/min/mmHg 18.32   DLCO UNC% % 89   DLVA Predicted % 132     Outpatient Medications Prior to Visit  Medication Sig Dispense Refill   albuterol (PROVENTIL HFA;VENTOLIN HFA) 108 (90 Base) MCG/ACT inhaler Inhale 2 puffs into the lungs every 4 (four) hours as needed for wheezing or shortness of breath.     albuterol (PROVENTIL) (2.5 MG/3ML) 0.083% nebulizer solution Take 3 mLs (2.5 mg total) by nebulization every 4 (four) hours as needed. 75 mL 6   ARIPiprazole (ABILIFY) 10 MG tablet Take 1 tablet (10 mg total) by mouth daily. 30 tablet 2   budesonide-formoterol (SYMBICORT) 160-4.5 MCG/ACT inhaler Inhale 2 puffs into the lungs 2 (two) times daily. 1 each 1   Cholecalciferol (VITAMIN D3) 50 MCG (2000 UT) TABS Take 2,000 Units by mouth daily.     fluticasone (FLONASE) 50 MCG/ACT nasal spray Place 2 sprays into both nostrils daily.     montelukast (SINGULAIR) 10 MG tablet Take 1 tablet by mouth daily.     ondansetron (ZOFRAN) 4 MG tablet Take 4 mg by mouth every 8 (eight) hours as needed for nausea or vomiting.     Spacer/Aero Chamber Mouthpiece MISC 1 Units by Does not apply route every 4 (four) hours as needed (wheezing). 1  each 0   vitamin B-12 (CYANOCOBALAMIN) 500 MCG tablet  Take 500 mcg by mouth daily.     No facility-administered medications prior to visit.
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
## 2023-05-24 ENCOUNTER — Encounter: Payer: Self-pay | Admitting: Psychiatry

## 2023-05-24 ENCOUNTER — Telehealth (INDEPENDENT_AMBULATORY_CARE_PROVIDER_SITE_OTHER): Payer: Medicare (Managed Care) | Admitting: Psychiatry

## 2023-05-24 DIAGNOSIS — F3341 Major depressive disorder, recurrent, in partial remission: Secondary | ICD-10-CM

## 2023-05-24 MED ORDER — ARIPIPRAZOLE 10 MG PO TABS: 10.0000 mg | ORAL_TABLET | Freq: Every day | ORAL | 2 refills | Status: DC

## 2023-06-23 ENCOUNTER — Other Ambulatory Visit: Payer: Self-pay | Admitting: Nurse Practitioner

## 2023-06-23 DIAGNOSIS — N939 Abnormal uterine and vaginal bleeding, unspecified: Secondary | ICD-10-CM

## 2023-06-23 DIAGNOSIS — Z8742 Personal history of other diseases of the female genital tract: Secondary | ICD-10-CM

## 2023-06-30 ENCOUNTER — Ambulatory Visit: Payer: Medicare (Managed Care)

## 2023-07-17 ENCOUNTER — Encounter: Payer: Self-pay | Admitting: Student in an Organized Health Care Education/Training Program

## 2023-07-17 ENCOUNTER — Ambulatory Visit
Admission: RE | Admit: 2023-07-17 | Discharge: 2023-07-17 | Disposition: A | Discharge status: Home / Self Care | Payer: Medicare (Managed Care) | Source: Ambulatory Visit | Attending: Student in an Organized Health Care Education/Training Program | Admitting: Student in an Organized Health Care Education/Training Program

## 2023-07-17 ENCOUNTER — Ambulatory Visit (INDEPENDENT_AMBULATORY_CARE_PROVIDER_SITE_OTHER): Payer: Medicare (Managed Care) | Admitting: Student in an Organized Health Care Education/Training Program

## 2023-07-17 VITALS — BP 128/86 | HR 96 | Temp 97.8°F | Ht 61.0 in | Wt 334.2 lb

## 2023-07-17 DIAGNOSIS — J4520 Mild intermittent asthma, uncomplicated: Secondary | ICD-10-CM | POA: Insufficient documentation

## 2023-07-17 DIAGNOSIS — G4733 Obstructive sleep apnea (adult) (pediatric): Secondary | ICD-10-CM

## 2023-07-17 DIAGNOSIS — R0602 Shortness of breath: Secondary | ICD-10-CM | POA: Diagnosis not present

## 2023-07-17 LAB — NITRIC OXIDE: Nitric Oxide: 19

## 2023-07-17 NOTE — Progress Notes (Signed)
Assessment & Plan:   #Mild Intermittent Asthma #OSA #Shortness of breath  Patient is presenting for an acute visit complaining of shortness of breath and cough that started a few days ago, resolved at the time of our evaluation. She has a history of asthma for which she is on Symbicort and with which she is compliant. Physical exam today is benign and reassuring, with no wheeze noted.  FeNO in clinic today is normal at 19 ppb. CXR ordered today and reviewed independently by me; it does not show any infiltrates, pneumothorax, pleural effusions, or otherwise signs of heart failure. She is feeling better today and her symptoms are overall resolved. I encouraged her to continue using Symbicort as prescribed with the spacer device.   PFT's performed during a prior visit showed only a very minimal obstructive defect, and was suggestive of restriction (no lung volumes were performed). ERV was notably low at 2% of predicted, which is consistent with her morbid obesity.   Overall I believe her shortness of breath is more secondary to obesity rather than obstructive lung disease, albeit given her diagnosis of asthma we will continue with Symbicort two puffs twice daily.  - DG Chest 2 View; Future > reviewed and within normal - FENO at 19 ppb today   Return in about 1 year (around 07/16/2024).  I spent 30 minutes caring for this patient today, including preparing to see the patient, obtaining a medical history , reviewing a separately obtained history, performing a medically appropriate examination and/or evaluation, counseling and educating the patient/family/caregiver, ordering medications, tests, or procedures, documenting clinical information in the electronic health record, and independently interpreting results (not separately reported/billed) and communicating results to the patient/family/caregiver  Raechel Chute, MD [facility]   End of visit medications:  No  orders of the defined types were placed in this encounter.    Current Outpatient Medications:    albuterol (PROVENTIL HFA;VENTOLIN HFA) 108 (90 Base) MCG/ACT inhaler, Inhale 2 puffs into the lungs every 4 (four) hours as needed for wheezing or shortness of breath., Disp: , Rfl:    albuterol (PROVENTIL) (2.5 MG/3ML) 0.083% nebulizer solution, Take 3 mLs (2.5 mg total) by nebulization every 4 (four) hours as needed., Disp: 75 mL, Rfl: 6   ARIPiprazole (ABILIFY) 10 MG tablet, Take 1 tablet (10 mg total) by mouth daily., Disp: 30 tablet, Rfl: 2   budesonide-formoterol (SYMBICORT) 160-4.5 MCG/ACT inhaler, Inhale 2 puffs into the lungs 2 (two) times daily., Disp: 1 each, Rfl: 1   Cholecalciferol (VITAMIN D3) 50 MCG (2000 UT) TABS, Take 2,000 Units by mouth daily., Disp: , Rfl:    fluticasone (FLONASE) 50 MCG/ACT nasal spray, Place 2 sprays into both nostrils daily., Disp: , Rfl:    montelukast (SINGULAIR) 10 MG tablet, Take 1 tablet by mouth daily., Disp: , Rfl:    ondansetron (ZOFRAN) 4 MG tablet, Take 4 mg by mouth every 8 (eight) hours as needed for nausea or vomiting., Disp: , Rfl:    Spacer/Aero Chamber Mouthpiece MISC, 1 Units by Does not apply route every 4 (four) hours as needed (wheezing)., Disp: 1 each, Rfl: 0   triamcinolone cream (KENALOG) 0.1 %, Apply 1 Application topically as needed., Disp: , Rfl:    Subjective:   PATIENT ID: Erica Webster GENDER: female DOB: 11-Dec-1985, MRN: [id number]  Chief Complaint  Patient presents with   Acute Visit    Wheezing, DOE and dry cough for 1 week.     HPI  Erica Webster  is a pleasant 37 year old female presenting to clinic for an acute visit today.  She reports feeling well up until a few days ago, when she developed sudden onset increased shortness of breath, cough, and chest tightness. She continued to use her inhalers as prescribed, and her symptoms today started to feel better. This morning, she has no wheeze or cough, and the shortness of  breath is resolved. She doesn't have any other symptoms. No fevers, chills, night sweats, or rashes are reported. This is similar to her symptoms in June, when she had a very brief episode of similar symptoms that included shortness of breath and chest tightness.   Patient has been on Symbicort that we initiated during a prior visit. She's also using albuterol as needed. She hasn't had any prescriptions for prednisone recently. No other symptoms or visits noted in the medical record since June of 2024. She reports being compliant with her CPAP.    She reports living in an apartment that she moved into not long ago. She does report a weird smell in the apartment which improved with scents. She has not had any recent prednisone. She is a non-smoker and is on disability.   Ancillary information including prior medications, full medical/surgical/family/social histories, and PFTs (when available) are listed below and have been reviewed.   Review of Systems  Constitutional:  Negative for chills, fever and weight loss.  Respiratory:  Negative for cough, hemoptysis, sputum production, shortness of breath and wheezing.   Cardiovascular:  Negative for chest pain and palpitations.     Objective:   Vitals:   07/17/23 1426  BP: 128/86  Pulse: 96  Temp: 97.8 F (36.6 C)  SpO2: 99%  Weight: (!) 334 lb 3.2 oz (151.6 kg)  Height: 5\' 1"  (1.549 m)   99% on RA BMI Readings from Last 3 Encounters:  07/17/23 63.15 kg/m  03/23/23 62.47 kg/m  01/24/23 60.54 kg/m   Wt Readings from Last 3 Encounters:  07/17/23 (!) 334 lb 3.2 oz (151.6 kg)  03/23/23 (!) 330 lb 9.6 oz (150 kg)  01/24/23 (!) 331 lb (150.1 kg)    Physical Exam Constitutional:      Appearance: She is obese. She is not ill-appearing.  HENT:     Mouth/Throat:     Mouth: Mucous membranes are moist.  Cardiovascular:     Pulses: Normal pulses.     Heart sounds: Normal heart sounds.  Pulmonary:     Effort: Pulmonary effort is normal.  No respiratory distress.     Breath sounds: Normal breath sounds. No wheezing, rhonchi or rales.  Abdominal:     General: There is distension.  Skin:    General: Skin is warm.  Neurological:     General: No focal deficit present.     Mental Status: She is alert and oriented to person, place, and time.       Ancillary Information    Past Medical History:  Diagnosis Date   Anxiety    Asthma    Depression    Dyspnea    Sleep apnea      Family History  Adopted: Yes  Problem Relation Age of Onset   Breast cancer Neg Hx      Past Surgical History:  Procedure Laterality Date   CHOLECYSTECTOMY     COLONOSCOPY WITH PROPOFOL N/A 03/12/2021   Procedure: COLONOSCOPY WITH PROPOFOL;  Surgeon: [name], MD;  Location: [facility] ENDOSCOPY;  Service: Endoscopy;  Laterality: N/A;   ESOPHAGOGASTRODUODENOSCOPY (EGD)  WITH PROPOFOL N/A 03/12/2021   Procedure: ESOPHAGOGASTRODUODENOSCOPY (EGD) WITH PROPOFOL;  Surgeon: [name], MD;  Location: [facility] ENDOSCOPY;  Service: Endoscopy;  Laterality: N/A;   TOENAIL EXCISION      Social History   Socioeconomic History   Marital status: Single    Spouse name: Not on file   Number of children: Not on file   Years of education: Not on file   Highest education level: Not on file  Occupational History   Not on file  Tobacco Use   Smoking status: Never   Smokeless tobacco: Never  Vaping Use   Vaping status: Never Used  Substance and Sexual Activity   Alcohol use: No   Drug use: No   Sexual activity: Not Currently  Other Topics Concern   Not on file  Social History Narrative   Not on file   Social Determinants of Health   Financial Resource Strain: Not on file  Food Insecurity: Not on file  Transportation Needs: Not on file  Physical Activity: Not on file  Stress: Not on file  Social Connections: Not on file  Intimate Partner Violence: Not on file     Allergies  Allergen Reactions   Shellfish Allergy Anaphylaxis    Penicillins Itching   Ferrous Sulfate Rash     CBC    Component Value Date/Time   WBC 8.1 04/07/2022 1046   RBC 4.52 04/07/2022 1046   HGB 10.3 (L) 04/07/2022 1046   HCT 36.4 04/07/2022 1046   PLT 347 04/07/2022 1046   MCV 80.5 04/07/2022 1046   MCH 22.8 (L) 04/07/2022 1046   MCHC 28.3 (L) 04/07/2022 1046   RDW 17.1 (H) 04/07/2022 1046   LYMPHSABS 2.3 04/07/2022 1046   MONOABS 0.4 04/07/2022 1046   EOSABS 0.1 04/07/2022 1046   BASOSABS 0.1 04/07/2022 1046    Pulmonary Functions Testing Results:    Latest Ref Rng & Units 01/17/2023    3:27 PM  PFT Results  FVC-Pre L 2.28   FVC-Predicted Pre % 65   FVC-Post L 2.29   FVC-Predicted Post % 66   Pre FEV1/FVC % % 82   Post FEV1/FCV % % 87   FEV1-Pre L 1.86   FEV1-Predicted Pre % 65   FEV1-Post L 1.98   DLCO uncorrected ml/min/mmHg 18.32   DLCO UNC% % 89   DLVA Predicted % 132     Outpatient Medications Prior to Visit  Medication Sig Dispense Refill   albuterol (PROVENTIL HFA;VENTOLIN HFA) 108 (90 Base) MCG/ACT inhaler Inhale 2 puffs into the lungs every 4 (four) hours as needed for wheezing or shortness of breath.     albuterol (PROVENTIL) (2.5 MG/3ML) 0.083% nebulizer solution Take 3 mLs (2.5 mg total) by nebulization every 4 (four) hours as needed. 75 mL 6   ARIPiprazole (ABILIFY) 10 MG tablet Take 1 tablet (10 mg total) by mouth daily. 30 tablet 2   budesonide-formoterol (SYMBICORT) 160-4.5 MCG/ACT inhaler Inhale 2 puffs into the lungs 2 (two) times daily. 1 each 1   Cholecalciferol (VITAMIN D3) 50 MCG (2000 UT) TABS Take 2,000 Units by mouth daily.     fluticasone (FLONASE) 50 MCG/ACT nasal spray Place 2 sprays into both nostrils daily.     montelukast (SINGULAIR) 10 MG tablet Take 1 tablet by mouth daily.     ondansetron (ZOFRAN) 4 MG tablet Take 4 mg by mouth every 8 (eight) hours as needed for nausea or vomiting.     Spacer/Aero Chamber Mouthpiece MISC 1 Units by  Does not apply route every 4 (four) hours as needed  (wheezing). 1 each 0   triamcinolone cream (KENALOG) 0.1 % Apply 1 Application topically as needed.     No facility-administered medications prior to visit.

## 2023-07-20 ENCOUNTER — Ambulatory Visit
Admission: RE | Admit: 2023-07-20 | Discharge: 2023-07-20 | Disposition: A | Discharge status: Home / Self Care | Payer: Medicare (Managed Care) | Source: Ambulatory Visit | Attending: Nurse Practitioner | Admitting: Nurse Practitioner

## 2023-07-20 DIAGNOSIS — Z8742 Personal history of other diseases of the female genital tract: Secondary | ICD-10-CM | POA: Insufficient documentation

## 2023-07-20 DIAGNOSIS — N939 Abnormal uterine and vaginal bleeding, unspecified: Secondary | ICD-10-CM | POA: Insufficient documentation

## 2023-07-26 ENCOUNTER — Telehealth: Payer: Self-pay | Admitting: Psychiatry

## 2023-07-26 NOTE — Telephone Encounter (Signed)
pt was notified of the direction per dr. Vanetta Shawl order.

## 2023-07-26 NOTE — Telephone Encounter (Signed)
Although I do not necessarily think the symptoms are due to Abilify, please advise her to lower the Abilify dose to 5 mg daily to see if it helps. If there is no change, advise her to follow up with her primary care provider for further evaluation.

## 2023-07-26 NOTE — Telephone Encounter (Signed)
I believe the patient has been on Abilify 10 mg at the same dose for over a year. Please advise her to double-check that she is taking the correct dose. If that's the case, it's less likely the medication is causing her current issues, as there has been no recent change and she hasn't reported any side effects before. Please also advise her to follow up with her primary care provider, and if her symptoms are severe, suggest she visit urgent care or the ED.

## 2023-07-26 NOTE — Telephone Encounter (Signed)
Patient does not know what to do regarding medication. Having side effects, filling dizzy, confused, and drowsy during the day, the Abilify. Please advise what to do.

## 2023-07-26 NOTE — Telephone Encounter (Signed)
spoke with patient she states that she seen her primary on the 17th and that dr. Lucretia Roers told her to contact you
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
## 2023-08-01 ENCOUNTER — Encounter: Payer: Self-pay | Admitting: Psychiatry

## 2023-08-01 ENCOUNTER — Telehealth (INDEPENDENT_AMBULATORY_CARE_PROVIDER_SITE_OTHER): Payer: Medicare (Managed Care) | Admitting: Psychiatry

## 2023-08-01 DIAGNOSIS — R42 Dizziness and giddiness: Secondary | ICD-10-CM

## 2023-08-01 DIAGNOSIS — F3341 Major depressive disorder, recurrent, in partial remission: Secondary | ICD-10-CM | POA: Diagnosis not present

## 2023-08-01 NOTE — Patient Instructions (Addendum)
Continue Abilify 5 mg daily  Obtain labs (CBC, CMP, TSH) Next appointment: 12/18 at 9 am

## 2023-08-23 ENCOUNTER — Telehealth: Payer: Medicare (Managed Care) | Admitting: Psychiatry

## 2023-09-14 NOTE — Congregational Nurse Program (Signed)
  Dept: [phone]   Congregational Nurse Program Note  Date of Encounter: 09/14/2023  Clinic visit to check blood pressure, has history of higher blood pressure at last MD visit but not high enough to be diagnosed as hypertension.  BP today 150/90 and 152/98.  Recommended checking blood pressure weekly next two weeks and seeing MD if any further elevation noted.  Educated regarding normal blood pressure levels and on decreasing intake of high sodium foods. Past Medical History: Past Medical History:  Diagnosis Date   Anxiety    Asthma    Depression    Dyspnea    Sleep apnea     Encounter Details:  Community Questionnaire - 09/14/23 1610       Questionnaire   Ask client: Do you give verbal consent for me to treat you today? Yes    Student Assistance N/A    Location Patient Served  N/A    Encounter Setting CN site    Population Status Unknown   Has own apartment at [facility]    Insurance/Financial Assistance Referral N/A    Medication N/A    Medical Provider Yes    Screening Referrals Made N/A    Medical Referrals Made N/A    Medical Appointment Completed N/A    CNP Interventions Advocate/Support;Counsel;Educate;Case Management    Screenings CN Performed Blood Pressure;Weight    ED Visit Averted N/A    Life-Saving Intervention Made N/A
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
## 2023-09-20 ENCOUNTER — Encounter: Payer: Self-pay | Admitting: Psychiatry

## 2023-09-20 ENCOUNTER — Telehealth (INDEPENDENT_AMBULATORY_CARE_PROVIDER_SITE_OTHER): Payer: Medicare (Managed Care) | Admitting: Psychiatry

## 2023-09-20 DIAGNOSIS — F3341 Major depressive disorder, recurrent, in partial remission: Secondary | ICD-10-CM

## 2023-09-20 DIAGNOSIS — R42 Dizziness and giddiness: Secondary | ICD-10-CM | POA: Diagnosis not present

## 2023-09-20 MED ORDER — ARIPIPRAZOLE 5 MG PO TABS: 5.0000 mg | ORAL_TABLET | Freq: Every day | ORAL | 2 refills | Status: DC

## 2023-09-22 ENCOUNTER — Encounter: Payer: Self-pay | Admitting: Student in an Organized Health Care Education/Training Program

## 2023-09-22 ENCOUNTER — Ambulatory Visit (INDEPENDENT_AMBULATORY_CARE_PROVIDER_SITE_OTHER): Payer: Medicare (Managed Care) | Admitting: Student in an Organized Health Care Education/Training Program

## 2023-09-22 VITALS — BP 126/80 | HR 87 | Temp 97.6°F | Ht 61.0 in | Wt 340.0 lb

## 2023-09-22 DIAGNOSIS — Z6841 Body Mass Index (BMI) 40.0 and over, adult: Secondary | ICD-10-CM

## 2023-09-22 DIAGNOSIS — J4520 Mild intermittent asthma, uncomplicated: Secondary | ICD-10-CM | POA: Diagnosis not present

## 2023-09-22 DIAGNOSIS — G4733 Obstructive sleep apnea (adult) (pediatric): Secondary | ICD-10-CM | POA: Diagnosis not present

## 2023-09-22 MED ORDER — BUDESONIDE-FORMOTEROL FUMARATE 160-4.5 MCG/ACT IN AERO: 2.0000 | INHALATION_SPRAY | Freq: Two times a day (BID) | RESPIRATORY_TRACT | 11 refills | Status: DC

## 2023-09-24 NOTE — Progress Notes (Signed)
Assessment & Plan:   #Mild intermittent asthma #OSA #Morbid Obesity  Presenting for follow up, with previously described symptoms fully resolved. She is maintained on Symbicort for mild intermittent asthma and CPAP for OSA and is compliant. Physical exam notes clear lungs and morbid obesity.  Discussed previous workup that was unremarkable, and benign findings. Encouraged her to continue to use Symbicort and CPAP. I believe her asthma is excellently controlled and is not contributing to her symptoms at this point. PFT's performed during a prior visit showed only a very minimal obstructive defect, and was suggestive of restriction (no lung volumes were performed). ERV was notably low at 2% of predicted, which is consistent with her morbid obesity.   Overall I believe her shortness of breath is more secondary to obesity rather than obstructive lung disease and I have recommended that the patient work on weight loss. She could be a candidate for bariatric surgery and I encouraged her to discuss with her PCP  - budesonide-formoterol (SYMBICORT) 160-4.5 MCG/ACT inhaler; Inhale 2 puffs into the lungs 2 (two) times daily.  Dispense: 3 each; Refill: 11 - weight loss recommended   Return in about 1 year (around 09/21/2024).  I spent 30 minutes caring for this patient today, including preparing to see the patient, obtaining a medical history , reviewing a separately obtained history, performing a medically appropriate examination and/or evaluation, counseling and educating the patient/family/caregiver, ordering medications, tests, or procedures, and documenting clinical information in the electronic health record  Raechel Chute, MD [facility]  End of visit medications:  Meds ordered this encounter  Medications   budesonide-formoterol (SYMBICORT) 160-4.5 MCG/ACT inhaler    Sig: Inhale 2 puffs into the lungs 2 (two) times daily.    Dispense:  3 each    Refill:  11      Current Outpatient Medications:    albuterol (PROVENTIL HFA;VENTOLIN HFA) 108 (90 Base) MCG/ACT inhaler, Inhale 2 puffs into the lungs every 4 (four) hours as needed for wheezing or shortness of breath., Disp: , Rfl:    albuterol (PROVENTIL) (2.5 MG/3ML) 0.083% nebulizer solution, Take 3 mLs (2.5 mg total) by nebulization every 4 (four) hours as needed., Disp: 75 mL, Rfl: 6   [START ON 09/25/2023] ARIPiprazole (ABILIFY) 5 MG tablet, Take 1 tablet (5 mg total) by mouth daily., Disp: 30 tablet, Rfl: 2   Cholecalciferol (VITAMIN D3) 50 MCG (2000 UT) TABS, Take 2,000 Units by mouth daily., Disp: , Rfl:    fluticasone (FLONASE) 50 MCG/ACT nasal spray, Place 2 sprays into both nostrils daily., Disp: , Rfl:    montelukast (SINGULAIR) 10 MG tablet, Take 1 tablet by mouth daily., Disp: , Rfl:    ondansetron (ZOFRAN) 4 MG tablet, Take 4 mg by mouth every 8 (eight) hours as needed for nausea or vomiting., Disp: , Rfl:    Spacer/Aero Chamber Mouthpiece MISC, 1 Units by Does not apply route every 4 (four) hours as needed (wheezing)., Disp: 1 each, Rfl: 0   triamcinolone cream (KENALOG) 0.1 %, Apply 1 Application topically as needed., Disp: , Rfl:    budesonide-formoterol (SYMBICORT) 160-4.5 MCG/ACT inhaler, Inhale 2 puffs into the lungs 2 (two) times daily., Disp: 3 each, Rfl: 11   Subjective:   PATIENT ID: Erica Webster GENDER: female DOB: 1986/08/13, MRN: [id number]  Chief Complaint  Patient presents with   Follow-up    No cough or wheezing. Shortness of breath on exertion.     HPI  Erica Webster is a pleasant 37 year  old female presenting to clinic for follow up. I had seen her for an acute visit in October and she is presenting for follow up.  She's done quite well since our last visit. Symptoms have been minimal and excellently controlled. She continues to have exertional dyspnea, but otherwise denies cough or chest tightness. She is compliant with her Symbicort inhaler. No fevers, chills,  night sweats, or rashes are reported. This is similar to her symptoms in June, when she had a very brief episode of similar symptoms that included shortness of breath and chest tightness. Patient continues to be compliant with her CPAP.    She reports living in an apartment that she moved into not long ago. She does report a weird smell in the apartment which improved with scents. She has not had any recent prednisone. She is a non-smoker and is on disability.   Ancillary information including prior medications, full medical/surgical/family/social histories, and PFTs (when available) are listed below and have been reviewed.   Review of Systems  Constitutional:  Negative for chills, fever and weight loss.  Respiratory:  Positive for shortness of breath (with exertion). Negative for cough, hemoptysis, sputum production and wheezing.   Cardiovascular:  Negative for chest pain and palpitations.     Objective:   Vitals:   09/22/23 1037  BP: 126/80  Pulse: 87  Temp: 97.6 F (36.4 C)  TempSrc: Temporal  SpO2: 98%  Weight: (!) 340 lb (154.2 kg)  Height: 5\' 1"  (1.549 m)   98% on RA BMI Readings from Last 3 Encounters:  09/22/23 64.24 kg/m  09/14/23 65.00 kg/m  07/17/23 63.15 kg/m   Wt Readings from Last 3 Encounters:  09/22/23 (!) 340 lb (154.2 kg)  09/14/23 (!) 344 lb (156 kg)  07/17/23 (!) 334 lb 3.2 oz (151.6 kg)    Physical Exam Constitutional:      Appearance: She is obese. She is not ill-appearing.  HENT:     Mouth/Throat:     Mouth: Mucous membranes are moist.  Cardiovascular:     Pulses: Normal pulses.     Heart sounds: Normal heart sounds.  Pulmonary:     Effort: Pulmonary effort is normal. No respiratory distress.     Breath sounds: Normal breath sounds. No wheezing, rhonchi or rales.  Abdominal:     General: There is distension.  Skin:    General: Skin is warm.  Neurological:     General: No focal deficit present.     Mental Status: She is alert and  oriented to person, place, and time.       Ancillary Information    Past Medical History:  Diagnosis Date   Anxiety    Asthma    Depression    Dyspnea    Sleep apnea      Family History  Adopted: Yes  Problem Relation Age of Onset   Breast cancer Neg Hx      Past Surgical History:  Procedure Laterality Date   CHOLECYSTECTOMY     COLONOSCOPY WITH PROPOFOL N/A 03/12/2021   Procedure: COLONOSCOPY WITH PROPOFOL;  Surgeon: [name], MD;  Location: [facility] ENDOSCOPY;  Service: Endoscopy;  Laterality: N/A;   ESOPHAGOGASTRODUODENOSCOPY (EGD) WITH PROPOFOL N/A 03/12/2021   Procedure: ESOPHAGOGASTRODUODENOSCOPY (EGD) WITH PROPOFOL;  Surgeon: [name], MD;  Location: [facility] END[facility];  Service: Endoscopy;  Laterality: N/A;   TOENAIL EXCISION      Social History   Socioeconomic History   Marital status: Single    Spouse name: Not  on file   Number of children: Not on file   Years of education: Not on file   Highest education level: Not on file  Occupational History   Not on file  Tobacco Use   Smoking status: Never   Smokeless tobacco: Never  Vaping Use   Vaping status: Never Used  Substance and Sexual Activity   Alcohol use: No   Drug use: No   Sexual activity: Not Currently  Other Topics Concern   Not on file  Social History Narrative   Not on file   Social Drivers of Health   Financial Resource Strain: Not on file  Food Insecurity: Not on file  Transportation Needs: Not on file  Physical Activity: Not on file  Stress: Not on file  Social Connections: Not on file  Intimate Partner Violence: Not on file     Allergies  Allergen Reactions   Shellfish Allergy Anaphylaxis   Penicillins Itching   Ferrous Sulfate Rash   Other Palpitations    Steroids     CBC    Component Value Date/Time   WBC 8.1 04/07/2022 1046   RBC 4.52 04/07/2022 1046   HGB 10.3 (L) 04/07/2022 1046   HCT 36.4 04/07/2022 1046   PLT 347 04/07/2022 1046   MCV 80.5  04/07/2022 1046   MCH 22.8 (L) 04/07/2022 1046   MCHC 28.3 (L) 04/07/2022 1046   RDW 17.1 (H) 04/07/2022 1046   LYMPHSABS 2.3 04/07/2022 1046   MONOABS 0.4 04/07/2022 1046   EOSABS 0.1 04/07/2022 1046   BASOSABS 0.1 04/07/2022 1046    Pulmonary Functions Testing Results:    Latest Ref Rng & Units 01/17/2023    3:27 PM  PFT Results  FVC-Pre L 2.28   FVC-Predicted Pre % 65   FVC-Post L 2.29   FVC-Predicted Post % 66   Pre FEV1/FVC % % 82   Post FEV1/FCV % % 87   FEV1-Pre L 1.86   FEV1-Predicted Pre % 65   FEV1-Post L 1.98   DLCO uncorrected ml/min/mmHg 18.32   DLCO UNC% % 89   DLVA Predicted % 132     Outpatient Medications Prior to Visit  Medication Sig Dispense Refill   albuterol (PROVENTIL HFA;VENTOLIN HFA) 108 (90 Base) MCG/ACT inhaler Inhale 2 puffs into the lungs every 4 (four) hours as needed for wheezing or shortness of breath.     albuterol (PROVENTIL) (2.5 MG/3ML) 0.083% nebulizer solution Take 3 mLs (2.5 mg total) by nebulization every 4 (four) hours as needed. 75 mL 6   [START ON 09/25/2023] ARIPiprazole (ABILIFY) 5 MG tablet Take 1 tablet (5 mg total) by mouth daily. 30 tablet 2   Cholecalciferol (VITAMIN D3) 50 MCG (2000 UT) TABS Take 2,000 Units by mouth daily.     fluticasone (FLONASE) 50 MCG/ACT nasal spray Place 2 sprays into both nostrils daily.     montelukast (SINGULAIR) 10 MG tablet Take 1 tablet by mouth daily.     ondansetron (ZOFRAN) 4 MG tablet Take 4 mg by mouth every 8 (eight) hours as needed for nausea or vomiting.     Spacer/Aero Chamber Mouthpiece MISC 1 Units by Does not apply route every 4 (four) hours as needed (wheezing). 1 each 0   triamcinolone cream (KENALOG) 0.1 % Apply 1 Application topically as needed.     budesonide-formoterol (SYMBICORT) 160-4.5 MCG/ACT inhaler Inhale 2 puffs into the lungs 2 (two) times daily. 1 each 1   No facility-administered medications prior to visit.

## 2023-11-15 ENCOUNTER — Other Ambulatory Visit: Payer: Self-pay | Admitting: Nurse Practitioner

## 2023-11-15 DIAGNOSIS — Z1231 Encounter for screening mammogram for malignant neoplasm of breast: Secondary | ICD-10-CM

## 2023-12-14 ENCOUNTER — Encounter: Payer: Medicare (Managed Care) | Admitting: Obstetrics and Gynecology
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
## 2023-12-22 ENCOUNTER — Telehealth: Payer: Medicare (Managed Care) | Admitting: Psychiatry

## 2023-12-22 ENCOUNTER — Encounter: Payer: Self-pay | Admitting: Psychiatry

## 2023-12-22 DIAGNOSIS — F3341 Major depressive disorder, recurrent, in partial remission: Secondary | ICD-10-CM | POA: Diagnosis not present

## 2023-12-22 MED ORDER — ARIPIPRAZOLE 5 MG PO TABS: 5.0000 mg | ORAL_TABLET | Freq: Every day | ORAL | 2 refills | Status: DC

## 2023-12-22 NOTE — Patient Instructions (Signed)
 Continue Abilify 5 mg daily  Next appointment: 6/20 at 8 am

## 2023-12-26 ENCOUNTER — Ambulatory Visit
Admission: RE | Admit: 2023-12-26 | Discharge: 2023-12-26 | Disposition: A | Discharge status: Home / Self Care | Payer: Medicare (Managed Care) | Source: Ambulatory Visit | Attending: Nurse Practitioner | Admitting: Nurse Practitioner

## 2023-12-26 DIAGNOSIS — Z1231 Encounter for screening mammogram for malignant neoplasm of breast: Secondary | ICD-10-CM | POA: Insufficient documentation

## 2024-01-13 NOTE — Progress Notes (Signed)
 [facility] [address] [place], [place] [address]  Pulmonary Sleep Medicine   Office Visit Note  Patient Name: Erica Webster DOB: 07-10-1986 MRN [id number]    Chief Complaint: Obstructive Sleep Apnea visit  Brief History:  Erica Webster is seen today for annual follow up on CPAP @14cmH20 . The patient has a 3 year history of sleep apnea. Patient is using PAP nightly.  The patient feels not as rested after sleeping with PAP.  The patient reports benefiting from PAP use. Reported sleepiness is  improved and the Epworth Sleepiness Score is 12 out of 24. The patient will sometimes take naps. The patient complains of the following: fatigue. The compliance download shows 81% compliance with an average use time of 6 hours 53 minutes. The AHI is 0.1.  The patient does not complain of limb movements disrupting sleep.  ROS  General: (-) fever, (-) chills, (-) night sweat Nose and Sinuses: (-) nasal stuffiness or itchiness, (-) postnasal drip, (-) nosebleeds, (-) sinus trouble. Mouth and Throat: (-) sore throat, (-) hoarseness. Neck: (-) swollen glands, (-) enlarged thyroid, (-) neck pain. Respiratory: + cough, + shortness of breath, + wheezing. Neurologic: - numbness, - tingling. Psychiatric: + anxiety, + depression   Current Medication: Outpatient Encounter Medications as of 01/15/2024  Medication Sig   albuterol  (PROVENTIL  HFA;VENTOLIN  HFA) 108 (90 Base) MCG/ACT inhaler Inhale 2 puffs into the lungs every 4 (four) hours as needed for wheezing or shortness of breath.   albuterol  (PROVENTIL ) (2.5 MG/3ML) 0.083% nebulizer solution Take 3 mLs (2.5 mg total) by nebulization every 4 (four) hours as needed.   ARIPiprazole  (ABILIFY ) 5 MG tablet Take 1 tablet (5 mg total) by mouth daily.   budesonide -formoterol  (SYMBICORT ) 160-4.5 MCG/ACT inhaler Inhale 2 puffs into the lungs 2 (two) times daily.   Cholecalciferol (VITAMIN D3) 50 MCG (2000 UT) TABS Take 2,000 Units by mouth daily.    FEROSUL 325 (65 Fe) MG tablet Take by mouth.   fluticasone (FLONASE) 50 MCG/ACT nasal spray Place 2 sprays into both nostrils daily.   montelukast (SINGULAIR) 10 MG tablet Take 1 tablet by mouth daily.   ondansetron  (ZOFRAN ) 4 MG tablet Take 4 mg by mouth every 8 (eight) hours as needed for nausea or vomiting.   Spacer/Aero Chamber Mouthpiece MISC 1 Units by Does not apply route every 4 (four) hours as needed (wheezing).   triamcinolone cream (KENALOG) 0.1 % Apply 1 Application topically as needed.   [DISCONTINUED] MOUNJARO 2.5 MG/0.5ML Pen Inject into the skin.   No facility-administered encounter medications on file as of 01/15/2024.    Surgical History: Past Surgical History:  Procedure Laterality Date   CHOLECYSTECTOMY     COLONOSCOPY WITH PROPOFOL  N/A 03/12/2021   Procedure: COLONOSCOPY WITH PROPOFOL ;  Surgeon: Shane Darling, MD;  Location: [facility] ENDOSCOPY;  Service: Endoscopy;  Laterality: N/A;   ESOPHAGOGASTRODUODENOSCOPY (EGD) WITH PROPOFOL  N/A 03/12/2021   Procedure: ESOPHAGOGASTRODUODENOSCOPY (EGD) WITH PROPOFOL ;  Surgeon: Shane Darling, MD;  Location: [facility] ENDOSCOPY;  Service: Endoscopy;  Laterality: N/A;   TOENAIL EXCISION      Medical History: Past Medical History:  Diagnosis Date   Anxiety    Asthma    Depression    Dyspnea    Sleep apnea     Family History: Non contributory to the present illness  Social History: Social History   Socioeconomic History   Marital status: Single    Spouse name: Not on file   Number of children: Not on file   Years of  education: Not on file   Highest education level: Not on file  Occupational History   Not on file  Tobacco Use   Smoking status: Never   Smokeless tobacco: Never  Vaping Use   Vaping status: Never Used  Substance and Sexual Activity   Alcohol use: No   Drug use: No   Sexual activity: Not Currently  Other Topics Concern   Not on file  Social History Narrative   Not on file   Social Drivers of  Health   Financial Resource Strain: Not on file  Food Insecurity: Not on file  Transportation Needs: Not on file  Physical Activity: Not on file  Stress: Not on file  Social Connections: Not on file  Intimate Partner Violence: Not on file    Vital Signs: Blood pressure (!) 156/98, pulse (!) 102, resp. rate 18, height 5\' 2"  (1.575 m), weight (!) 353 lb (160.1 kg), last menstrual period 12/09/2023, SpO2 99%. Body mass index is 64.56 kg/m.    Examination: General Appearance: The patient is well-developed, well-nourished, and in no distress. Neck Circumference: 39 cm Skin: Gross inspection of skin unremarkable. Head: normocephalic, no gross deformities. Eyes: no gross deformities noted. ENT: ears appear grossly normal Neurologic: Alert and oriented. No involuntary movements.  STOP BANG RISK ASSESSMENT S (snore) Have you been told that you snore?     NO   T (tired) Are you often tired, fatigued, or sleepy during the day?   NO  O (obstruction) Do you stop breathing, choke, or gasp during sleep? NO   P (pressure) Do you have or are you being treated for high blood pressure? YES   B (BMI) Is your body index greater than 35 kg/m? YES   A (age) Are you 20 years old or older? NO   N (neck) Do you have a neck circumference greater than 16 inches?   NO   G (gender) Are you a female? NO   TOTAL STOP/BANG "YES" ANSWERS 2       A STOP-Bang score of 2 or less is considered low risk, and a score of 5 or more is high risk for having either moderate or severe OSA. For people who score 3 or 4, doctors may need to perform further assessment to determine how likely they are to have OSA.         EPWORTH SLEEPINESS SCALE:  Scale:  (0)= no chance of dozing; (1)= slight chance of dozing; (2)= moderate chance of dozing; (3)= high chance of dozing  Chance  Situtation    Sitting and reading: 0    Watching TV: 2    Sitting Inactive in public: 3    As a passenger in car: 3      Lying  down to rest: 3    Sitting and talking: 0    Sitting quielty after lunch: 1    In a car, stopped in traffic: 0   TOTAL SCORE:   12 out of 24    SLEEP STUDIES:  Split - 10/01/20 - AHI 49.8/hr, REM AHI 102.9/hr, min Sp02 62%   CPAP COMPLIANCE DATA:  Date Range: 01/11/2023 - 01/10/2024  Average Daily Use: 7 hours 16 minutes  Median Use: 7 hours 26 minutes  Compliance for > 4 Hours: 81% days  AHI: 0.1 respiratory events per hour  Days Used: 346/365  Mask Leak: 12  95th Percentile Pressure: 14cmH20         LABS: No results found for this or any  previous visit (from the past 2160 hours).  Radiology: MM 3D SCREENING MAMMOGRAM BILATERAL BREAST Result Date: 12/28/2023 CLINICAL DATA:  Screening. EXAM: DIGITAL SCREENING BILATERAL MAMMOGRAM WITH TOMOSYNTHESIS AND CAD TECHNIQUE: Bilateral screening digital craniocaudal and mediolateral oblique mammograms were obtained. Bilateral screening digital breast tomosynthesis was performed. The images were evaluated with computer-aided detection. COMPARISON:  Previous exam(s). ACR Breast Density Category a: The breasts are almost entirely fatty. FINDINGS: There are no findings suspicious for malignancy. IMPRESSION: No mammographic evidence of malignancy. A result letter of this screening mammogram will be mailed directly to the patient. RECOMMENDATION: Screening mammogram in one year. (Code:SM-B-01Y) BI-RADS CATEGORY  1: Negative. Electronically Signed   By: Alinda Apley M.D.   On: 12/28/2023 10:42    No results found.  MM 3D SCREENING MAMMOGRAM BILATERAL BREAST Result Date: 12/28/2023 CLINICAL DATA:  Screening. EXAM: DIGITAL SCREENING BILATERAL MAMMOGRAM WITH TOMOSYNTHESIS AND CAD TECHNIQUE: Bilateral screening digital craniocaudal and mediolateral oblique mammograms were obtained. Bilateral screening digital breast tomosynthesis was performed. The images were evaluated with computer-aided detection. COMPARISON:  Previous exam(s).  ACR Breast Density Category a: The breasts are almost entirely fatty. FINDINGS: There are no findings suspicious for malignancy. IMPRESSION: No mammographic evidence of malignancy. A result letter of this screening mammogram will be mailed directly to the patient. RECOMMENDATION: Screening mammogram in one year. (Code:SM-B-01Y) BI-RADS CATEGORY  1: Negative. Electronically Signed   By: Alinda Apley M.D.   On: 12/28/2023 10:42      Assessment and Plan: Patient Active Problem List   Diagnosis Date Noted   Elevated blood pressure reading in office without diagnosis of hypertension 01/16/2023   OSA (obstructive sleep apnea) 06/14/2021   Moderate persistent asthma 06/14/2021   OSA on CPAP 02/15/2021   CPAP use counseling 02/15/2021   Morbid obesity (HCC) 02/15/2021   Status post laparoscopic cholecystectomy 01/26/2021   Severe obesity (BMI >= 40) (HCC) 12/10/2020   1. OSA on CPAP (Primary) The patient does tolerate PAP and reports  benefit from PAP use. The patient was reminded how to clean equipment and advised to replace supplies routinely. The patient was also counselled on weight loss. The compliance is good . The AHI is 0.1.   OSA on cpap- controlled. Continue with good compliance with pap. CPAP continues to be medically necessary to treat this patient's OSA. F/u one year.    2. CPAP use counseling CPAP Counseling: had a lengthy discussion with the patient regarding the importance of PAP therapy in management of the sleep apnea. Patient appears to understand the risk factor reduction and also understands the risks associated with untreated sleep apnea. Patient will try to make a good faith effort to remain compliant with therapy. Also instructed the patient on proper cleaning of the device including the water must be changed daily if possible and use of distilled water is preferred. Patient understands that the machine should be regularly cleaned with appropriate recommended cleaning  solutions that do not damage the PAP machine for example given white vinegar and water rinses. Other methods such as ozone treatment may not be as good as these simple methods to achieve cleaning.   3. Morbid obesity (HCC)  Obesity Counseling: Had a lengthy discussion regarding patients BMI and weight issues. Patient was instructed on portion control as well as increased activity. Also discussed caloric restrictions with trying to maintain intake less than 2000 Kcal. Discussions were made in accordance with the 5As of weight management. Simple actions such as not eating late and if able  to, taking a walk is suggested.     General Counseling: I have discussed the findings of the evaluation and examination with Erica Webster.  I have also discussed any further diagnostic evaluation thatmay be needed or ordered today. Erica Webster verbalizes understanding of the findings of todays visit. We also reviewed her medications today and discussed drug interactions and side effects including but not limited excessive drowsiness and altered mental states. We also discussed that there is always a risk not just to her but also people around her. she has been encouraged to call the office with any questions or concerns that should arise related to todays visit.  No orders of the defined types were placed in this encounter.       I have personally obtained a history, examined the patient, evaluated laboratory and imaging results, formulated the assessment and plan and placed orders. This patient was seen today by Louann Rous, PA-C in collaboration with Dr. Cam Cava.   Cordie Deters, MD [facility] Diplomate ABMS Pulmonary Critical Care Medicine and Sleep Medicine

## 2024-01-15 ENCOUNTER — Ambulatory Visit (INDEPENDENT_AMBULATORY_CARE_PROVIDER_SITE_OTHER): Payer: Medicare (Managed Care) | Admitting: Internal Medicine

## 2024-01-15 VITALS — BP 156/98 | HR 102 | Resp 18 | Ht 62.0 in | Wt 353.0 lb

## 2024-01-15 DIAGNOSIS — G4733 Obstructive sleep apnea (adult) (pediatric): Secondary | ICD-10-CM | POA: Diagnosis not present

## 2024-01-15 DIAGNOSIS — Z7189 Other specified counseling: Secondary | ICD-10-CM | POA: Diagnosis not present

## 2024-01-15 NOTE — Patient Instructions (Signed)

## 2024-01-23 ENCOUNTER — Encounter: Payer: Medicare (Managed Care) | Admitting: Obstetrics and Gynecology

## 2024-01-23 DIAGNOSIS — D219 Benign neoplasm of connective and other soft tissue, unspecified: Secondary | ICD-10-CM

## 2024-01-23 DIAGNOSIS — Z7689 Persons encountering health services in other specified circumstances: Secondary | ICD-10-CM

## 2024-01-23 DIAGNOSIS — N939 Abnormal uterine and vaginal bleeding, unspecified: Secondary | ICD-10-CM

## 2024-02-03 ENCOUNTER — Emergency Department
Admission: EM | Admit: 2024-02-03 | Discharge: 2024-02-03 | Disposition: A | Discharge status: Home / Self Care | Payer: Medicare (Managed Care) | Attending: Emergency Medicine | Admitting: Emergency Medicine

## 2024-02-03 ENCOUNTER — Emergency Department: Payer: Medicare (Managed Care)

## 2024-02-03 DIAGNOSIS — M79652 Pain in left thigh: Secondary | ICD-10-CM | POA: Insufficient documentation

## 2024-02-03 DIAGNOSIS — J45909 Unspecified asthma, uncomplicated: Secondary | ICD-10-CM | POA: Diagnosis not present

## 2024-02-03 DIAGNOSIS — X509XXA Other and unspecified overexertion or strenuous movements or postures, initial encounter: Secondary | ICD-10-CM | POA: Diagnosis not present

## 2024-02-03 MED ORDER — ACETAMINOPHEN 500 MG PO TABS
1000.0000 mg | ORAL_TABLET | Freq: Once | ORAL | Status: AC
  Administered 2024-02-03: 1000 mg via ORAL
  Filled 2024-02-03: qty 2

## 2024-02-03 MED ORDER — IBUPROFEN 600 MG PO TABS
600.0000 mg | ORAL_TABLET | Freq: Once | ORAL | Status: AC
  Administered 2024-02-03: 600 mg via ORAL
  Filled 2024-02-03: qty 1

## 2024-02-03 NOTE — ED Provider Notes (Signed)
 [facility] Provider Note    Event Date/Time   First MD Initiated Contact with Patient 02/03/24 0406     (approximate)   History   Leg Pain   HPI  Erica Webster is a 38 y.o. female   Past medical history of anxiety, depression, asthma, sleep apnea who presents Emergency Department with left anterior/lateral thigh pain.  This been going on for several weeks.  She might of twisted her thigh going up the stairs about a month ago when she misplaced her step.  She had bilateral knee pain and thigh pain and most of the pain is gone away but this left thigh pain has been ongoing since that time.  She has been able to ambulate.  She denies a history of blood clot.  She denies any skin changes surrounding that area.   External Medical Documents Reviewed: An encounter in the hospital for left thigh pain in 2023 with negative x-ray imaging and DVT scan      Physical Exam   Triage Vital Signs: ED Triage Vitals [02/03/24 0359]  Encounter Vitals Group     BP (!) 145/77     Systolic BP Percentile      Diastolic BP Percentile      Pulse Rate 85     Resp 18     Temp 98.8 F (37.1 C)     Temp Source Oral     SpO2 94 %     Weight      Height      Head Circumference      Peak Flow      Pain Score 10     Pain Loc      Pain Education      Exclude from Growth Chart     Most recent vital signs: Vitals:   02/03/24 0359  BP: (!) 145/77  Pulse: 85  Resp: 18  Temp: 98.8 F (37.1 C)  SpO2: 94%    General: Awake, no distress.  CV:  Good peripheral perfusion.  Resp:  Normal effort.  Abd:  No distention.  Other:  She has point tenderness to the anterior thigh.  There is no skin changes to the area.  There is no obvious mass to the area.  She is ambulatory with a steady gait.   ED Results / Procedures / Treatments   Labs (all labs ordered are listed, but only abnormal results are displayed) Labs Reviewed - No data to display     RADIOLOGY I  independently reviewed and interpreted xr femur and see no fractures I also reviewed radiologist's formal read.   PROCEDURES:  Critical Care performed: No  Procedures   MEDICATIONS ORDERED IN ED: Medications  ibuprofen  (ADVIL ) tablet 600 mg (has no administration in time range)  acetaminophen  (TYLENOL ) tablet 1,000 mg (has no administration in time range)    IMPRESSION / MDM / ASSESSMENT AND PLAN / ED COURSE  I reviewed the triage vital signs and the nursing notes.                                Patient's presentation is most consistent with acute presentation with potential threat to life or bodily function.  Differential diagnosis includes, but is not limited to, muscle strain, muscle tear, DVT, bone malignancy, considered but less likely fractures or dislocations, ischemic limb    MDM:    She probably strained her thigh muscle when she  had that slipping injury at the top of the stairs a few weeks ago.  I am getting a DVT ultrasound to rule out DVT.  I am getting x-ray less for concerns for fracture or dislocation but instead to see if there is any evidence of any abnormal bone growth/malignancies.  Examination does not show any signs of skin infection, shingles, or abscess.    Ultimately if these imaging studies are unremarkable I will have her discharge and follow-up with her PMD for suspected muscle strain.       FINAL CLINICAL IMPRESSION(S) / ED DIAGNOSES   Final diagnoses:  Left thigh pain     Rx / DC Orders   ED Discharge Orders     None        Note:  This document was prepared using Dragon voice recognition software and may include unintentional dictation errors.    Buell Carmin, MD 02/03/24 [phone]

## 2024-02-03 NOTE — Discharge Instructions (Signed)
 Fortunately your evaluation in the emergency department did not show any emergency conditions like broken bones, or blood clot to explain your thigh pain.  I think it is likely due to a muscle strain.    Take acetaminophen  650 mg and ibuprofen  400 mg every 6 hours for pain.  Take with food.  Thank you for choosing us  for your health care today!  Please see your primary doctor this week for a follow up appointment.   If you have any new, worsening, or unexpected symptoms call your doctor right away or come back to the emergency department for reevaluation.  It was my pleasure to care for you today.   Arron Large Margery Sheets, MD

## 2024-02-03 NOTE — ED Triage Notes (Signed)
 Pt to ED from home with left upper leg pain for over one week that is preventing her from sleeping well. Denies redness/swelling/prior trauma at ths site.

## 2024-02-07 ENCOUNTER — Other Ambulatory Visit: Payer: Self-pay

## 2024-02-07 ENCOUNTER — Emergency Department
Admission: EM | Admit: 2024-02-07 | Discharge: 2024-02-07 | Disposition: A | Discharge status: Home / Self Care | Payer: Medicare (Managed Care) | Attending: Emergency Medicine | Admitting: Emergency Medicine

## 2024-02-07 DIAGNOSIS — J45909 Unspecified asthma, uncomplicated: Secondary | ICD-10-CM | POA: Diagnosis not present

## 2024-02-07 DIAGNOSIS — M79652 Pain in left thigh: Secondary | ICD-10-CM | POA: Insufficient documentation

## 2024-02-07 MED ORDER — MELOXICAM 15 MG PO TABS: 15.0000 mg | ORAL_TABLET | Freq: Every day | ORAL | 0 refills | Status: AC

## 2024-02-07 NOTE — Discharge Instructions (Signed)
 I believe the pain in your left thigh is due to a muscle strain or nerve compression.  Please take the meloxicam  (Mobic ) once a day for 2 weeks.  This is an anti-inflammatory.  Do not take other NSAIDs while taking this medication.  NSAIDs include ibuprofen , Motrin , Advil , naproxen, Aleve, celecoxib , and Celebrex .  You can take 650 mg of Tylenol  every 6 hours as needed for pain. You can use ice, heat, muscle creams and other topical pain relievers as well.  Return to the emergency department with any worsening symptoms.

## 2024-02-07 NOTE — ED Triage Notes (Signed)
 Pt here for L upper thigh pain, pt seen for same on 02/03/24, pt denies new injury

## 2024-02-07 NOTE — ED Provider Notes (Signed)
 [facility] Provider Note    Event Date/Time   First MD Initiated Contact with Patient 02/07/24 1046     (approximate)   History   Leg Pain   HPI  Erica Webster is a 38 y.o. female  with PMH of asthma, depression, anxiety, sleep apnea, and obesity presents for ongoing leg pain.  Patient was seen for this on 5/3 and had negative x-rays and negative DVT ultrasound.  She reports no changes in the quality or severity of the pain since she was last evaluated.  Describes the pain as being on the anterior left thigh and it is sharp.  She also has some numbness in the area.      Physical Exam   Triage Vital Signs: ED Triage Vitals  Encounter Vitals Group     BP 02/07/24 1030 129/84     Systolic BP Percentile --      Diastolic BP Percentile --      Pulse Rate 02/07/24 1030 92     Resp 02/07/24 1030 19     Temp 02/07/24 1030 98.5 F (36.9 C)     Temp Source 02/07/24 1030 Oral     SpO2 02/07/24 1030 98 %     Weight 02/07/24 1031 (!) 352 lb 11.8 oz (160 kg)     Height 02/07/24 1031 5\' 2"  (1.575 m)     Head Circumference --      Peak Flow --      Pain Score 02/07/24 1030 9     Pain Loc --      Pain Education --      Exclude from Growth Chart --     Most recent vital signs: Vitals:   02/07/24 1030  BP: 129/84  Pulse: 92  Resp: 19  Temp: 98.5 F (36.9 C)  SpO2: 98%   General: Awake, no distress.  CV:  Good peripheral perfusion.  Resp:  Normal effort.  Abd:  No distention.  Other:  No overlying skin changes or swelling to the left anterior thigh.  Tender to palpation over the anterior thigh with some decreased sensation.   ED Results / Procedures / Treatments   Labs (all labs ordered are listed, but only abnormal results are displayed) Labs Reviewed - No data to display   PROCEDURES:  Critical Care performed: No  Procedures   MEDICATIONS ORDERED IN ED: Medications - No data to display   IMPRESSION / MDM / ASSESSMENT AND PLAN  / ED COURSE  I reviewed the triage vital signs and the nursing notes.                             38 year old female presents for evaluation of anterior left thigh pain.  Vital signs are stable patient NAD on exam.  Differential diagnosis includes, but is not limited to, meralgia paresthetica, muscle strain, muscle tear.  Patient's presentation is most consistent with acute, uncomplicated illness.  Given that patient has not had any changes in her pain since her last evaluation I did not feel that repeat imaging was necessary.  Patient is tender to palpation in the distribution of the lateral femoral cutaneous nerve and also reports of numbness to the area.  Suspect meralgia paresthetica versus muscle strain.  Offered to give patient a shot of Toradol  and dexamethasone  while in the ED but she reports fear of needles so she declined.  Will send a prescription for meloxicam .  Advised her  to take Tylenol  as needed for pain.  She voiced understanding, all questions were answered and she is stable at discharge.    FINAL CLINICAL IMPRESSION(S) / ED DIAGNOSES   Final diagnoses:  Pain of left thigh     Rx / DC Orders   ED Discharge Orders          Ordered    meloxicam  (MOBIC ) 15 MG tablet  Daily        02/07/24 1143             Note:  This document was prepared using Dragon voice recognition software and may include unintentional dictation errors.   Phyliss Breen, PA-C 02/07/24 1144    Lubertha Rush, MD 02/09/24 2051

## 2024-02-22 ENCOUNTER — Encounter: Payer: Medicare (Managed Care) | Admitting: Obstetrics and Gynecology

## 2024-03-05 ENCOUNTER — Encounter: Payer: Medicare (Managed Care) | Admitting: Physical Therapy
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
## 2024-03-22 ENCOUNTER — Telehealth: Payer: Medicare (Managed Care) | Admitting: Psychiatry

## 2024-03-22 ENCOUNTER — Encounter: Payer: Self-pay | Admitting: Psychiatry

## 2024-03-22 DIAGNOSIS — G2581 Restless legs syndrome: Secondary | ICD-10-CM

## 2024-03-22 DIAGNOSIS — F3341 Major depressive disorder, recurrent, in partial remission: Secondary | ICD-10-CM

## 2024-03-22 MED ORDER — ARIPIPRAZOLE 5 MG PO TABS: 5.0000 mg | ORAL_TABLET | Freq: Every day | ORAL | 2 refills | Status: DC

## 2024-03-22 NOTE — Patient Instructions (Signed)
 Continue Abilify  5 mg daily  Obtain lab- ferritin, CBC at [facility].   Next appointment: 7/25 at 8 am

## 2024-03-28 ENCOUNTER — Ambulatory Visit: Payer: Medicare (Managed Care) | Admitting: Physical Therapy

## 2024-04-01 ENCOUNTER — Ambulatory Visit: Payer: Medicare (Managed Care) | Admitting: Physical Therapy

## 2024-04-03 ENCOUNTER — Other Ambulatory Visit: Payer: Self-pay | Admitting: Student in an Organized Health Care Education/Training Program

## 2024-04-03 DIAGNOSIS — J4540 Moderate persistent asthma, uncomplicated: Secondary | ICD-10-CM

## 2024-04-04 ENCOUNTER — Ambulatory Visit: Payer: Medicare (Managed Care)

## 2024-04-10 ENCOUNTER — Encounter: Payer: Medicare (Managed Care) | Admitting: Obstetrics and Gynecology

## 2024-04-10 DIAGNOSIS — Z7689 Persons encountering health services in other specified circumstances: Secondary | ICD-10-CM

## 2024-04-10 DIAGNOSIS — D219 Benign neoplasm of connective and other soft tissue, unspecified: Secondary | ICD-10-CM

## 2024-04-10 DIAGNOSIS — Z124 Encounter for screening for malignant neoplasm of cervix: Secondary | ICD-10-CM

## 2024-04-15 ENCOUNTER — Encounter: Payer: Medicare (Managed Care) | Admitting: Physical Therapy

## 2024-04-16 ENCOUNTER — Telehealth: Payer: Self-pay

## 2024-04-16 NOTE — Telephone Encounter (Signed)
 Given that the patient has been on this medication for quite a while, I think it's less likely that it's the cause of the reported side effect. However, if she continues to feel uncomfortable, we can consider lowering the dose from 5 mg to 2 mg.

## 2024-04-16 NOTE — Telephone Encounter (Signed)
 pt states that she does not feel like she can take the abilify . she states that she feels jittery and on edge. she states that she is sleeping alot. she feels like she can not function to do housework. she just feels off. she last seen on 6-30 next appt 7-26

## 2024-04-17 ENCOUNTER — Other Ambulatory Visit: Payer: Self-pay | Admitting: Psychiatry

## 2024-04-17 ENCOUNTER — Encounter: Payer: Medicare (Managed Care) | Admitting: Physical Therapy

## 2024-04-17 MED ORDER — ARIPIPRAZOLE 2 MG PO TABS: 2.0000 mg | ORAL_TABLET | Freq: Every day | ORAL | 0 refills | Status: DC

## 2024-04-17 NOTE — Telephone Encounter (Signed)
 Ordered. Please contact the pharmacy and cancel Abilify  5 mg refills.

## 2024-04-17 NOTE — Telephone Encounter (Signed)
 pt was notified please send the 2mg  to the [facility] on [address].

## 2024-04-17 NOTE — Telephone Encounter (Signed)
pharmacy notified.

## 2024-04-17 NOTE — Telephone Encounter (Signed)
 Patient called left voicemail returned call to patient to inform her that her medication has been sent to the pharmacy patient voiced understanding

## 2024-04-17 NOTE — Telephone Encounter (Signed)
 left message that rx had been sent to the pharmacy
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
## 2024-04-23 ENCOUNTER — Ambulatory Visit: Payer: Medicare (Managed Care) | Attending: Nurse Practitioner

## 2024-04-23 DIAGNOSIS — M79652 Pain in left thigh: Secondary | ICD-10-CM | POA: Diagnosis present

## 2024-04-23 NOTE — Therapy (Signed)
 OUTPATIENT PHYSICAL THERAPY LOWER EXTREMITY EVALUATION   Patient Name: Erica Webster MRN: [id number] DOB:[birth year], 38 y.o., female Today's Date: 04/23/2024  END OF SESSION:  PT End of Session - 04/23/24 1145     Visit Number 1    Number of Visits 1    Date for PT Re-Evaluation 04/23/24    PT Start Time 1146    PT Stop Time 1203    PT Time Calculation (min) 17 min    Equipment Utilized During Treatment --    Activity Tolerance Other (comment)   no activity performed in session   Behavior During Therapy [facility] for tasks assessed/performed          Past Medical History:  Diagnosis Date   Anxiety    Asthma    Depression    Dyspnea    Sleep apnea    Past Surgical History:  Procedure Laterality Date   CHOLECYSTECTOMY     COLONOSCOPY WITH PROPOFOL  N/A 03/12/2021   Procedure: COLONOSCOPY WITH PROPOFOL ;  Surgeon: Maryruth Ole DASEN, MD;  Location: [facility] ENDOSCOPY;  Service: Endoscopy;  Laterality: N/A;   ESOPHAGOGASTRODUODENOSCOPY (EGD) WITH PROPOFOL  N/A 03/12/2021   Procedure: ESOPHAGOGASTRODUODENOSCOPY (EGD) WITH PROPOFOL ;  Surgeon: Maryruth Ole DASEN, MD;  Location: [facility] ENDOSCOPY;  Service: Endoscopy;  Laterality: N/A;   TOENAIL EXCISION     Patient Active Problem List   Diagnosis Date Noted   Elevated blood pressure reading in office without diagnosis of hypertension 01/16/2023   OSA (obstructive sleep apnea) 06/14/2021   Moderate persistent asthma 06/14/2021   OSA on CPAP 02/15/2021   CPAP use counseling 02/15/2021   Morbid obesity (HCC) 02/15/2021   Status post laparoscopic cholecystectomy 01/26/2021   Severe obesity (BMI >= 40) (HCC) 12/10/2020    [facility]  REFERRING PROVIDER: Catheryn Devonshire, FNP  REFERRING DIAG: G57.12 (ICD-10-CM) - Meralgia paresthetica, left lower limb   THERAPY DIAG:  Pain in left thigh  Rationale for Evaluation and Treatment: Rehabilitation  ONSET DATE: April 2025  SUBJECTIVE:   SUBJECTIVE  STATEMENT: Pt reports that she is not in any pain today. Pt states that she is having a lot of depression lately due to the loss of her family members (mom, dad, and brother). Pt reports that her leg is feeling much better as far as her leg goes, and she denies any physical limitations to her walking or daily activities. Pt says that she has been wanting to get in touch with a therapist to talk about her grieving, but she has had troubles getting in touch with someone.   PERTINENT HISTORY: Pt was seen at [facility] ED on 02/03/24 for worsening L anterior/lateral thigh pain after she twisted it going up the stairs at home. ED physician ordered US  venous imaging to rule out DVT, which was negative. ED physician suspected possible muscle strain in anterior thigh. Pt [facility] on 02/07/24 due to no changes in pain and new onset of numbness in the anterior thigh. Per ED physician note: Patient is tender to palpation in the distribution of the lateral femoral cutaneous nerve and also reports of numbness to the area. Suspect meralgia paresthetica versus muscle strain.  PMH includes OSA, moderate persistent asthma, obesity PAIN:  Are you having pain? No  PRECAUTIONS: None  RED FLAGS: None   WEIGHT BEARING RESTRICTIONS: No  FALLS:  Has patient fallen in last 6 months? Yes. Number of falls Pt had a fall going up the stairs at home that caused this injury in April  LIVING ENVIRONMENT: Lives with: lives alone- cousin is able to come check on her, but not frequently Lives in: House/apartment Stairs: Yes: External: 3 flights steps; on left going up    OBJECTIVE:  Note: Objective measures were completed at Evaluation unless otherwise noted.  DIAGNOSTIC FINDINGS:  DG L Femur (from 02/03/24): EXAM: LEFT FEMUR 2 VIEWS   COMPARISON:  None Available.   FINDINGS: There is no evidence of fracture or other focal bone lesions. Soft tissues are unremarkable.   IMPRESSION: Negative.  US  Venous Imaging  Lower Unilateral Left (from 02/03/24): FINDINGS: VENOUS   Normal compressibility of the common femoral, superficial femoral, and popliteal veins, as well as the visualized calf veins. Visualized portions of profunda femoral vein and great saphenous vein unremarkable. No filling defects to suggest DVT on grayscale or color Doppler imaging. Doppler waveforms show normal direction of venous flow, normal respiratory plasticity and response to augmentation.   Limited views of the contralateral common femoral vein are unremarkable.   OTHER   None.   Limitations: none   IMPRESSION: Negative.                                                                                                                                 TREATMENT DATE: 04/23/24   Eval only  PATIENT EDUCATION:  Education details: Pt educated on the purpose of physical therapy to treat physical impairments and was provided education on resources to aid in her search for a counselor/therapist Person educated: Patient Education method: Chief Technology Officer Education comprehension: verbalized understanding   ASSESSMENT:  CLINICAL IMPRESSION: Patient is a 38 y.o. female who was seen today for physical therapy evaluation and treatment for L leg pain. Pt presented with no pain currently and stated that she has no physical limitations that would be addressed by PT. Pt is currently struggling with a lot mentally due to grief and loneliness and has had difficulty finding emotional support/counseling. Pt was provided website information at [email or web address] and was shown a demonstration for how to find local resources for grief counseling. Pt is currently not appropriate for physical therapy as she currently has no limitations, but pt was informed that she can return to clinic at any time with new order in the future if any physical issues arise.    CLINICAL DECISION MAKING: Stable/uncomplicated  EVALUATION COMPLEXITY:  Low   GOALS: none at this time   PLAN:  PT FREQUENCY: N/A  PT DURATION: other: 1 session only   PLAN FOR NEXT SESSION: N/A  Revecca Nachtigal, SPT  This entire session was performed under direct supervision and direction of a licensed therapist/therapist assistant . I have personally read, edited and approve of the note as written.   Marina  Moser, PT 04/23/2024, 2:34 PM

## 2024-04-25 ENCOUNTER — Ambulatory Visit: Payer: Medicare (Managed Care)

## 2024-04-26 ENCOUNTER — Encounter: Payer: Self-pay | Admitting: Psychiatry

## 2024-04-26 ENCOUNTER — Telehealth (INDEPENDENT_AMBULATORY_CARE_PROVIDER_SITE_OTHER): Payer: Medicare (Managed Care) | Admitting: Psychiatry

## 2024-04-26 DIAGNOSIS — F331 Major depressive disorder, recurrent, moderate: Secondary | ICD-10-CM | POA: Diagnosis not present

## 2024-04-26 DIAGNOSIS — D509 Iron deficiency anemia, unspecified: Secondary | ICD-10-CM

## 2024-04-26 DIAGNOSIS — R5383 Other fatigue: Secondary | ICD-10-CM

## 2024-04-26 MED ORDER — BUPROPION HCL ER (XL) 150 MG PO TB24: 150.0000 mg | ORAL_TABLET | Freq: Every day | ORAL | 1 refills | Status: DC

## 2024-04-26 MED ORDER — ARIPIPRAZOLE 2 MG PO TABS: 2.0000 mg | ORAL_TABLET | Freq: Every day | ORAL | 0 refills | Status: DC

## 2024-04-30 ENCOUNTER — Ambulatory Visit (INDEPENDENT_AMBULATORY_CARE_PROVIDER_SITE_OTHER): Payer: Medicare (Managed Care) | Admitting: Psychology

## 2024-04-30 ENCOUNTER — Ambulatory Visit: Payer: Medicare (Managed Care)

## 2024-04-30 DIAGNOSIS — F331 Major depressive disorder, recurrent, moderate: Secondary | ICD-10-CM | POA: Diagnosis not present

## 2024-04-30 NOTE — Progress Notes (Unsigned)
   Erica Webster, [facility]

## 2024-04-30 NOTE — Progress Notes (Unsigned)
 [facility] Counselor Initial Adult Exam  Name: Erica Webster Date: 04/30/2024 MRN: [id number] DOB: November 17, 1985 PCP: [facility]  Time spent: 1:40pm-2:33pm  Pt is seen for a virtual video visit via caregility.  Counselor called when not logged on and assisted in connecting to virtual visit.  Pt joins from her apartment reporting privacy and counselor from her home office.  Pt consents to virtual visits and is aware of limitations of such visits.    Guardian/Payee:  self    Paperwork requested: No   Reason for Visit /Presenting Problem: referred by Dr. Vickey since 2021.  I got sad news this month.  Cousin paternal side of family passed away.  Look young reminded of when brother passed away, my parents passed away.  Found out form another cousin and told me don't remember meeting him.  Made me so depressed.  I go tout of apartment and got out and had to take care of business- when came back all I could thinking about my brother.  Cousin had kidney issues-covid.  Mad me really sad and talked to my aunt and funeral is this weekend.  2 cousins keep in touch a lot- coming down for his funeral [place].  Family in [place]. Originally from [facility] [place]- lived w/ parents until 20y/o when mom passed cancer and 24 when dad passed dementia-stroke.  Brother passed late 110s.  I needed family to be around and lonely in [place].  I have felt lonely recently.  Adult day program- mainly for older people.  2nd one wasn't happy with.  Since 2015- toxic friendship- 2020/21. Mental health and disabiities. 2021.  They want her to get back into programs an she isn't comfortable with.   Dream about mom wasn't good dream.  Yelling and cursing in dream forcing her way in house.   Cpap machine.   Sister younger 40 years younger.  [place] 2 older brothers late [address]. 10 years older other brother.  Weight issues diabetes- didn't take care of.  Call names about saying no.    I don't want to end  up like my brother.    Mental Status Exam: Appearance:   Well Groomed     Behavior:  Appropriate  Motor:  Normal  Speech/Language:   Clear and Coherent and Normal Rate  Affect:  Depressed and Tearful  Mood:  depressed  Thought process:  normal  Thought content:    WNL  Sensory/Perceptual disturbances:    WNL  Orientation:  oriented to person, place, time/date, and situation  Attention:  Good  Concentration:  Good  Memory:  WNL  Fund of knowledge:   Good  Insight:    Good  Judgment:   Good  Impulse Control:  Good   Reported Symptoms:  ***  Risk Assessment: Danger to Self:  No Self-injurious Behavior: No Danger to Others: No Duty to Warn:no Physical Aggression / Violence:No  Access to Firearms a concern: No  Gang Involvement:No  Patient / guardian was educated about steps to take if suicide or homicide risk level increases between visits: no While future psychiatric events cannot be accurately predicted, the patient does not currently require acute inpatient psychiatric care and does not currently meet   involuntary commitment criteria.  Substance Abuse History: Current substance abuse: No     Past Psychiatric History:   Previous psychological history is significant for depression Outpatient Providers:38 years old for depression.  [place] medication and therapy. History of Psych Hospitalization: No  Psychological Testing: none  reported   Abuse History:  Victim of: No., none   Report needed: No. Victim of Neglect:No. Perpetrator of none  Witness / Exposure to Domestic Violence: No   Protective Services Involvement: No  Witness to [employer] Violence:  No   Family History:  Family History  Adopted: Yes  Problem Relation Age of Onset   Breast cancer Neg Hx   Maternal side uncle and wife. She has been sick recently hard to see her. Cousin maternal help care for her as young child.  She lives close by.   Paternal side 2 cousins.   Aunt [place] maternal  side.    Living situation: the patient lives alone.  In  [place].  No pets.    Sexual Orientation: Straight  Relationship Status: single  N children children   Support Systems: extended family  Financial Stress:  No   Income/Employment/Disability: Social Conservation officer, nature Service: No   Educational History: Education: high school diploma/GED  Religion/Sprituality/World View: [employer]  Any cultural differences that may affect / interfere with treatment:  not applicable  fri[place]n [place].    Recreation/Hobbies: {Woc hobbies:30428}  Stressors: {PATIENT STRESSORS:22669}  Strengths: Family  Barriers:  don't drive.   Legal History: Pending legal issue / charges: The patient has no significant history of legal issues. History of legal issue / charges: none  Medical History/Surgical History: reviewed Past Medical History:  Diagnosis Date   Anxiety    Asthma    Depression    Dyspnea    Sleep apnea     Past Surgical History:  Procedure Laterality Date   CHOLECYSTECTOMY     COLONOSCOPY WITH PROPOFOL  N/A 03/12/2021   Procedure: COLONOSCOPY WITH PROPOFOL ;  Surgeon: Maryruth Ole DASEN, MD;  Location: [facility] ENDOSCOPY;  Service: Endoscopy;  Laterality: N/A;   ESOPHAGOGASTRODUODENOSCOPY (EGD) WITH PROPOFOL  N/A 03/12/2021   Procedure: ESOPHAGOGASTRODUODENOSCOPY (EGD) WITH PROPOFOL ;  Surgeon: Maryruth Ole DASEN, MD;  Location: [facility] ENDOSCOPY;  Service: Endoscopy;  Laterality: N/A;   TOENAIL EXCISION      Medications: Current Outpatient Medications  Medication Sig Dispense Refill   albuterol  (PROVENTIL  HFA;VENTOLIN  HFA) 108 (90 Base) MCG/ACT inhaler Inhale 2 puffs into the lungs every 4 (four) hours as needed for wheezing or shortness of breath.     albuterol  (PROVENTIL ) (2.5 MG/3ML) 0.083% nebulizer solution USE 1 VIAL IN NEBULIZER EVERY 4 HOURS AS NEEDED 165 mL 0   [START ON 05/17/2024] ARIPiprazole  (ABILIFY ) 2 MG tablet Take 1 tablet (2 mg total) by  mouth at bedtime. 30 tablet 0   budesonide -formoterol  (SYMBICORT ) 160-4.5 MCG/ACT inhaler Inhale 2 puffs into the lungs 2 (two) times daily. 3 each 11   buPROPion  (WELLBUTRIN  XL) 150 MG 24 hr tablet Take 1 tablet (150 mg total) by mouth daily. 30 tablet 1   Cholecalciferol (VITAMIN D3) 50 MCG (2000 UT) TABS Take 2,000 Units by mouth daily.     ferrous gluconate (FERGON) 324 MG tablet Take 324 mg by mouth daily with breakfast.     ferrous gluconate (FERGON) 324 MG tablet Take 324 mg by mouth daily with breakfast.     fluticasone (FLONASE) 50 MCG/ACT nasal spray Place 2 sprays into both nostrils daily.     montelukast (SINGULAIR) 10 MG tablet Take 1 tablet by mouth daily.     ondansetron  (ZOFRAN ) 4 MG tablet Take 4 mg by mouth every 8 (eight) hours as needed for nausea or vomiting.     Spacer/Aero Chamber Mouthpiece MISC 1 Units by Does not apply route every 4 (  four) hours as needed (wheezing). 1 each 0   triamcinolone cream (KENALOG) 0.1 % Apply 1 Application topically as needed.     No current facility-administered medications for this visit.    Allergies  Allergen Reactions   Shellfish Allergy Anaphylaxis   Penicillins Itching   Ferrous Sulfate Rash   Other Palpitations    Steroids    Diagnoses:  MDD (major depressive disorder), recurrent episode, moderate (HCC)  Plan of Care: PIERRETTE BARBARANN APPL, [facility]

## 2024-05-20 ENCOUNTER — Ambulatory Visit (INDEPENDENT_AMBULATORY_CARE_PROVIDER_SITE_OTHER): Payer: Medicare (Managed Care) | Admitting: Psychology

## 2024-05-20 ENCOUNTER — Encounter: Payer: Self-pay | Admitting: Psychology

## 2024-05-20 DIAGNOSIS — F331 Major depressive disorder, recurrent, moderate: Secondary | ICD-10-CM | POA: Diagnosis not present

## 2024-05-20 NOTE — Progress Notes (Unsigned)
   Erica Webster, [facility]

## 2024-05-20 NOTE — Progress Notes (Unsigned)
 [facility] Counselor/Therapist Progress Note  Patient ID: Erica Webster, MRN: [id number],    Date: 05/20/2024  Time Spent: 3:33pm-4:17pm   Treatment Type: Individual Therapy  Pt is seen for a virtual video visit via caregility.   Pt joins from her apartment, reporting privacy, and counselor from her home office.  Pt consents to virtual visits and is aware of limitations of such visits.    Reported Symptoms: less tearful, less down, increased engagement.    Mental Status Exam: Appearance:  Well Groomed     Behavior: Appropriate  Motor: Normal  Speech/Language:  Clear and Coherent and Normal Rate  Affect: Appropriate  Mood: depressed  Thought process: normal  Thought content:   WNL  Sensory/Perceptual disturbances:   WNL  Orientation: oriented to person, place, time/date, and situation  Attention: Good  Concentration: Good  Memory: WNL  Fund of knowledge:  Good  Insight:   Good  Judgment:  Good  Impulse Control: Good   Risk Assessment: Danger to Self:  No Self-injurious Behavior: No Danger to Others: No Duty to Warn:no Physical Aggression / Violence:No  Access to Firearms a concern: No  Gang Involvement:No   Subjective: Counselor assessed pt current functioning per pt report.  Processed w/pt positives, stressors and moods. Developed tx plan w/ pt input and consent. Explored and provided pt w/ grounding skills for coping with dreams. Identified supports and ways to engage w/activities/interests.  Pt affect wnl.  Pt reports she had spent 2 weeks w/ her aunt in [place] and that time was beneficial to her and her mood.  Pt reports she has been walking daily and feels that positive for her as well.  Pt reports less depressed and lonely.  Pt reports still struggles w/ dreams that feel very real of interactions w/ parents.  Pt reports when wakes upsetting to her.  Pt receptive to grounding skills and identified ways to use for self.  Pt states she feels good about  her weight loss of 22lbs and will be starting w/ nutritionist.  Pt reports her social worker at PCP office is looking into programs for her, but she doesn't want to go back to a day tx program w/ negative experiences.  Pt receptive to engaging w/ activities and support in community.  Interventions: Cognitive Behavioral Therapy, Mindfulness Meditation, and Solution-Oriented/Positive Psychology  Diagnosis:MDD (major depressive disorder), recurrent episode, moderate (HCC)  Plan: Pt to f/u w/ counseling every 2-3 weeks.  Pt to f/u w/ Dr. Vickey as scheduled.  Pt to f/u w/ PCP as scheduled.  Individualized Treatment Plan Strengths: seeking counseling, walking,  enjoy window shopping, listen to music and watch t.v.  Supports: maternal aunt and maternal cousin- extended family.   Goal/Needs for Treatment:  In order of importance to patient 1) depression  2) grief 3) ---   Client Statement of Needs: I want to not take myself too seriously.  Think positively about my life and my health and not focus so much on the past.  I want to do more in my life and figure out what that might be.   Treatment Level:outpatient counseling  Symptoms:depression, grief, loneliness  Client Treatment Preferences:individual counseling,  virtual counseling every 2-3 weeks, work w/ Dr. Hisada for medication management.  Not seeking day tx program currently.   Healthcare consumer's goal for treatment:  Counselor, Damien Herald, [facility] will support the patient's ability to achieve the goals identified. Cognitive Behavioral Therapy, Assertive Communication/Conflict Resolution Training, Relaxation Training, ACT, Humanistic and other evidenced-based practices will  be used to promote progress towards healthy functioning.   Healthcare consumer will: Actively participate in therapy, working towards healthy functioning.    *Justification for Continuation/Discontinuation of Goal: R=Revised, O=Ongoing, A=Achieved,  D=Discontinued  Goal 1) Increase engagement w/ interests and supports, increase use of grounding skills and maintain daily self care to assist coping and reducing depressive symptoms by pt report and therapist observation.  Baseline date 05/20/24: Progress towards goal 0; How Often - Daily Target Date Goal Was reviewed Status Code Progress towards goal/Likert rating  05/20/25                Goal 2) give safe space to verbalize grief and identify ways to connect in present AEB pt report and therapist observation.  Baseline date 05/20/24: Progress towards goal 0; How Often - Daily Target Date Goal Was reviewed Status Code Progress towards goal  05/20/25                This plan has been reviewed and created by the following participants:  This plan will be reviewed at least every 12 months. Date Behavioral Health Clinician Date Guardian/Patient   05/20/24 [facility] Barbarann [facility]  05/20/24 Verbal Consent Provided and electronic consent requested                    BARBARANN APPL [facility]

## 2024-05-29 ENCOUNTER — Ambulatory Visit: Payer: Medicare (Managed Care) | Admitting: Obstetrics and Gynecology

## 2024-05-29 ENCOUNTER — Encounter: Payer: Self-pay | Admitting: Obstetrics and Gynecology

## 2024-05-29 VITALS — BP 147/97 | HR 82 | Ht 62.0 in | Wt 343.0 lb

## 2024-05-29 DIAGNOSIS — D259 Leiomyoma of uterus, unspecified: Secondary | ICD-10-CM | POA: Diagnosis not present

## 2024-05-29 DIAGNOSIS — N939 Abnormal uterine and vaginal bleeding, unspecified: Secondary | ICD-10-CM

## 2024-05-29 DIAGNOSIS — Z7689 Persons encountering health services in other specified circumstances: Secondary | ICD-10-CM | POA: Diagnosis not present

## 2024-05-29 DIAGNOSIS — Z124 Encounter for screening for malignant neoplasm of cervix: Secondary | ICD-10-CM

## 2024-05-29 DIAGNOSIS — D219 Benign neoplasm of connective and other soft tissue, unspecified: Secondary | ICD-10-CM

## 2024-05-29 NOTE — Progress Notes (Signed)
 Patient presents today to discuss fibroids and heavy menstrual cycles. She reports her cycles are typically regular in timing but states they are very heavy along with severe cramping. She reports never trying anything for cycle control because it makes her nervous.

## 2024-05-29 NOTE — Progress Notes (Signed)
 HPI:      [name] is a 38 y.o. G0P0000 who LMP was Patient's last menstrual period was 05/02/2024 (approximate).  Subjective:   She presents today to discuss her menstrual periods.  She states that she generally has a 5-day periods but last month it lasted 7 days.  She reports that last year she had a 10-day period.  A previous ultrasound showed uterine fibroids.  Endometrium was not overgrown at that time.  She is concerned that if she does not do something now her periods could get worse in the future. She is not interested in any form of hormonal control of her menses. She is not interested in any type of surgery, including UFE.    Hx: The following portions of the patient's history were reviewed and updated as appropriate:             She  has a past medical history of Anxiety, Asthma, Depression, Dyspnea, and Sleep apnea. She does not have any pertinent problems on file. She  has a past surgical history that includes Toenail excision; Cholecystectomy; Esophagogastroduodenoscopy (egd) with propofol  (N/A, 03/12/2021); and Colonoscopy with propofol  (N/A, 03/12/2021). Her family history is not on file. She was adopted. She  reports that she has never smoked. She has never used smokeless tobacco. She reports that she does not drink alcohol and does not use drugs. She has a current medication list which includes the following prescription(s): albuterol , albuterol , aripiprazole , budesonide -formoterol , vitamin d3, ferrous gluconate, ferrous gluconate, fluticasone, ondansetron , spacer/aero chamber mouthpiece, and triamcinolone cream. She is allergic to shellfish allergy, penicillins, ferrous sulfate, and other.       Review of Systems:  Review of Systems  Constitutional: Denied constitutional symptoms, night sweats, recent illness, fatigue, fever, insomnia and weight loss.  Eyes: Denied eye symptoms, eye pain, photophobia, vision change and visual disturbance.  Ears/Nose/Throat/Neck:  Denied ear, nose, throat or neck symptoms, hearing loss, nasal discharge, sinus congestion and sore throat.  Cardiovascular: Denied cardiovascular symptoms, arrhythmia, chest pain/pressure, edema, exercise intolerance, orthopnea and palpitations.  Respiratory: Denied pulmonary symptoms, asthma, pleuritic pain, productive sputum, cough, dyspnea and wheezing.  Gastrointestinal: Denied, gastro-esophageal reflux, melena, nausea and vomiting.  Genitourinary: Denied genitourinary symptoms including symptomatic vaginal discharge, pelvic relaxation issues, and urinary complaints.  Musculoskeletal: Denied musculoskeletal symptoms, stiffness, swelling, muscle weakness and myalgia.  Dermatologic: Denied dermatology symptoms, rash and scar.  Neurologic: Denied neurology symptoms, dizziness, headache, neck pain and syncope.  Psychiatric: Denied psychiatric symptoms, anxiety and depression.  Endocrine: Denied endocrine symptoms including hot flashes and night sweats.   Meds:   Current Outpatient Medications on File Prior to Visit  Medication Sig Dispense Refill   albuterol  (PROVENTIL  HFA;VENTOLIN  HFA) 108 (90 Base) MCG/ACT inhaler Inhale 2 puffs into the lungs every 4 (four) hours as needed for wheezing or shortness of breath.     albuterol  (PROVENTIL ) (2.5 MG/3ML) 0.083% nebulizer solution USE 1 VIAL IN NEBULIZER EVERY 4 HOURS AS NEEDED 165 mL 0   ARIPiprazole  (ABILIFY ) 2 MG tablet Take 1 tablet (2 mg total) by mouth at bedtime. 30 tablet 0   budesonide -formoterol  (SYMBICORT ) 160-4.5 MCG/ACT inhaler Inhale 2 puffs into the lungs 2 (two) times daily. 3 each 11   Cholecalciferol (VITAMIN D3) 50 MCG (2000 UT) TABS Take 2,000 Units by mouth daily.     ferrous gluconate (FERGON) 324 MG tablet Take 324 mg by mouth daily with breakfast.     ferrous gluconate (FERGON) 324 MG tablet Take 324 mg by mouth daily with  breakfast.     fluticasone (FLONASE) 50 MCG/ACT nasal spray Place 2 sprays into both nostrils daily.      ondansetron  (ZOFRAN ) 4 MG tablet Take 4 mg by mouth every 8 (eight) hours as needed for nausea or vomiting.     Spacer/Aero Chamber Mouthpiece MISC 1 Units by Does not apply route every 4 (four) hours as needed (wheezing). 1 each 0   triamcinolone cream (KENALOG) 0.1 % Apply 1 Application topically as needed.     No current facility-administered medications on file prior to visit.      Objective:     Vitals:   05/29/24 1018  BP: (!) 147/97  Pulse: 82   Filed Weights   05/29/24 1018  Weight: (!) 343 lb (155.6 kg)                        Assessment:    G0P0000 Patient Active Problem List   Diagnosis Date Noted   Elevated blood pressure reading in office without diagnosis of hypertension 01/16/2023   OSA (obstructive sleep apnea) 06/14/2021   Moderate persistent asthma 06/14/2021   OSA on CPAP 02/15/2021   CPAP use counseling 02/15/2021   Morbid obesity (HCC) 02/15/2021   Status post laparoscopic cholecystectomy 01/26/2021   Severe obesity (BMI >= 40) (HCC) 12/10/2020     1. Establishing care with new doctor, encounter for   2. Abnormal uterine bleeding (AUB)   3. Fibroid   4. Cervical cancer screening     Minimal changes to menses likely secondary to uterine fibroid.  Patient is concerned regarding a single 7-day.  Instead of 5 and her concern regarding a single 10-day period instead of 5 over the last 2 years are likely insignificant issues, especially in someone who has uterine fibroids.    Plan:            1.  We have discussed multiple options including hormonal cycle control, UFE, and surgical options.  Patient is not interested in any of these options.  We have also discussed NSAID S during her menses.  The fact that her cycles are generally fine and lasting 5 days with a rare one lasting longer -I have reassured her that this is perfectly normal and she does not need to do anything now unless she continues to have long menses with significant dysmenorrhea.   She seemed reassured by this.  Orders No orders of the defined types were placed in this encounter.   No orders of the defined types were placed in this encounter.     F/U  No follow-ups on file.  Alm DOROTHA Sar, M.D. 05/29/2024 10:49 AM
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
## 2024-06-07 ENCOUNTER — Telehealth: Payer: Medicare (Managed Care) | Admitting: Psychiatry

## 2024-06-07 ENCOUNTER — Encounter: Payer: Self-pay | Admitting: Psychiatry

## 2024-06-07 DIAGNOSIS — F3341 Major depressive disorder, recurrent, in partial remission: Secondary | ICD-10-CM | POA: Diagnosis not present

## 2024-06-07 MED ORDER — ARIPIPRAZOLE 2 MG PO TABS: 2.0000 mg | ORAL_TABLET | Freq: Every day | ORAL | 1 refills | Status: DC

## 2024-06-07 NOTE — Patient Instructions (Signed)
 Continue Abilify  2 mg at night  Next appointment: 10/29 at 8 40

## 2024-06-12 ENCOUNTER — Ambulatory Visit (INDEPENDENT_AMBULATORY_CARE_PROVIDER_SITE_OTHER): Payer: Medicare (Managed Care) | Admitting: Psychology

## 2024-06-12 DIAGNOSIS — F330 Major depressive disorder, recurrent, mild: Secondary | ICD-10-CM | POA: Diagnosis not present
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
## 2024-06-17 ENCOUNTER — Telehealth: Payer: Self-pay

## 2024-06-17 NOTE — Telephone Encounter (Signed)
 Yes. Please advise to have a sooner visit- in person would be the best, but I can see virtual if that works better. In the mean time, please obtain record from her primary care provider.

## 2024-06-17 NOTE — Telephone Encounter (Signed)
 Medication problem - Patient left a message requesting to speak to Dr. Vickey about side effects. Patient stated she has been feeling oversedated and having more difficulty functioning and doing daily activities and thinks this may be due to Abilify . Patient requested a call back from provider to discuss.

## 2024-06-17 NOTE — Telephone Encounter (Signed)
 Please contact her- she has been on Abilify  for many years without experiencing side effects, and we reduced the dose a few months ago due to her concerns. Given that she was doing well at her last visit, it is difficult to attribute her current symptoms directly to Abilify . She also has a history of anemia, which can present with symptoms that overlap with psychiatric side effects such as fatigue. Please advise her to contact her primary care provider to rule out other potential medical causes, including iron deficiency. Please let me know if any additional concerns.

## 2024-06-17 NOTE — Telephone Encounter (Signed)
 Medication management - Call with patient to discuss her status with reported increased fatique and not feeling herself more and more latel. Informed pt Dr. Hisada did not think her Abilify  should be causing this since she had actually decreased it once prior and has been on this for so long.  Pt. Reported she has always had problems feeling this way since she started Abilify  and that it has gradually gotten worse.  Pt denied could be due to her history of anemia and stated her PCP told her to call here as she is taking iron supplements currently.  Patient not receptive to thinking this may be due to medical and agreed to inform Dr. Vickey of this.  Patient stated she would agree to an earlier appointment if needed to discuss more with provider and agreed to send this information back to her and to see if she wanted pt to be scheduled earlier.

## 2024-06-18 ENCOUNTER — Telehealth (INDEPENDENT_AMBULATORY_CARE_PROVIDER_SITE_OTHER): Payer: Medicare (Managed Care) | Admitting: Psychiatry

## 2024-06-18 ENCOUNTER — Encounter: Payer: Self-pay | Admitting: Psychiatry

## 2024-06-18 DIAGNOSIS — D509 Iron deficiency anemia, unspecified: Secondary | ICD-10-CM

## 2024-06-18 DIAGNOSIS — F330 Major depressive disorder, recurrent, mild: Secondary | ICD-10-CM | POA: Diagnosis not present

## 2024-06-18 DIAGNOSIS — F4311 Post-traumatic stress disorder, acute: Secondary | ICD-10-CM | POA: Diagnosis not present

## 2024-06-18 NOTE — Patient Instructions (Signed)
 Discontinue Abilify  due to reported adverse reaction of drowsiness.   Obtain lab- TSH  Obtain ROI to obtain record from her PCP Next appointment: 9/24 at 10:30
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
## 2024-06-26 ENCOUNTER — Encounter: Payer: Self-pay | Admitting: Psychiatry

## 2024-06-26 ENCOUNTER — Telehealth (INDEPENDENT_AMBULATORY_CARE_PROVIDER_SITE_OTHER): Payer: Medicare (Managed Care) | Admitting: Psychiatry

## 2024-06-26 DIAGNOSIS — F331 Major depressive disorder, recurrent, moderate: Secondary | ICD-10-CM | POA: Diagnosis not present

## 2024-06-26 DIAGNOSIS — F4311 Post-traumatic stress disorder, acute: Secondary | ICD-10-CM

## 2024-06-26 NOTE — Patient Instructions (Signed)
 Hold off Abilify   Obtain lab- TSH  Next appointment: 11/5 at 8 20

## 2024-07-10 ENCOUNTER — Ambulatory Visit (INDEPENDENT_AMBULATORY_CARE_PROVIDER_SITE_OTHER): Payer: Medicare (Managed Care) | Admitting: Psychology

## 2024-07-10 DIAGNOSIS — F330 Major depressive disorder, recurrent, mild: Secondary | ICD-10-CM | POA: Diagnosis not present

## 2024-07-10 NOTE — Progress Notes (Signed)
 [facility] Counselor/Therapist Progress Note  Patient ID: Erica Webster, MRN: [id number],    Date: 07/10/2024  Time Spent: 2:31pm-3:10pm   Treatment Type: Individual Therapy  Pt is seen for a virtual video visit via caregility.   Pt joins from her apartment, reporting privacy, and counselor from her home office.  Pt consents to virtual visits and is aware of limitations of such visits.    Reported Symptoms: Pt reports mood improving- less down. Pt reports feeling more settled and safe again at her apartment.   Mental Status Exam: Appearance:  Well Groomed     Behavior: Appropriate  Motor: Normal  Speech/Language:  Clear and Coherent and Normal Rate  Affect: Appropriate  Mood: anxious and sad  Thought process: normal  Thought content:   WNL  Sensory/Perceptual disturbances:   WNL  Orientation: oriented to person, place, time/date, and situation  Attention: Good  Concentration: Good  Memory: WNL  Fund of knowledge:  Good  Insight:   Good  Judgment:  Good  Impulse Control: Good   Risk Assessment: Danger to Self:  No Self-injurious Behavior: No Danger to Others: No Duty to Warn:no Physical Aggression / Violence:No  Access to Firearms a concern: No  Gang Involvement:No   Subjective: Counselor assessed pt current functioning per pt report.  Processed w/pt recent stressors and positives.  Reflected improvements in mood and feelings of safety.  Encouraged pt to keep discussing side effects w/ psychiatrist and follow plan they develop together.  Discussed good sleep hygiene. Explored recent reconnecting w/ old friend and taking slowly as gets to know again and if interactions comfortable.  Pt affect wnl.  Pt reports she is feeling that mood is good.  Pt reports less depressed.  Pt reports that her anxiety is settled again and feels safe going on walks at apartment again.  Pt reports she is going to bed around 7:30pm and often feels tired not able to wake easily in  morning.  Pt reports that she talked w/ Dr. Vickey about and she wants her to continue w/ meds at this time as mood improved.  Pt reports ran into old friend that distance from couple years ago after feeling uncomfortable w/ interactions.  Pt reports decided to give another chance and she seemed distance when text/talked.  Pt increased awareness to take slowly as get to know again and whether health for her or not.   Interventions: Cognitive Behavioral Therapy, Assertiveness/Communication, and Mindfulness Meditation  Diagnosis:MDD (major depressive disorder), recurrent episode, mild  Plan: Pt to f/u w/ counseling every 2-4weeks.  Pt to f/u w/ Dr. Vickey as scheduled.  Pt to f/u w/ PCP as scheduled.  Individualized Treatment Plan Strengths: seeking counseling, walking,  enjoy window shopping, listen to music and watch t.v.  Supports: maternal aunt and maternal cousin- extended family.   Goal/Needs for Treatment:  In order of importance to patient 1) depression  2) grief 3) ---   Client Statement of Needs: I want to not take myself too seriously.  Think positively about my life and my health and not focus so much on the past.  I want to do more in my life and figure out what that might be.   Treatment Level:outpatient counseling  Symptoms:depression, grief, loneliness  Client Treatment Preferences:individual counseling,  virtual counseling every 2-3 weeks, work w/ Dr. Hisada for medication management.  Not seeking day tx program currently.   Healthcare consumer's goal for treatment:  Counselor, Damien Herald, [facility] will support the patient's ability to  achieve the goals identified. Cognitive Behavioral Therapy, Assertive Communication/Conflict Resolution Training, Relaxation Training, ACT, Humanistic and other evidenced-based practices will be used to promote progress towards healthy functioning.   Healthcare consumer will: Actively participate in therapy, working towards healthy  functioning.    *Justification for Continuation/Discontinuation of Goal: R=Revised, O=Ongoing, A=Achieved, D=Discontinued  Goal 1) Increase engagement w/ interests and supports, increase use of grounding skills and maintain daily self care to assist coping and reducing depressive symptoms by pt report and therapist observation.  Baseline date 05/20/24: Progress towards goal 0; How Often - Daily Target Date Goal Was reviewed Status Code Progress towards goal/Likert rating  05/20/25                Goal 2) give safe space to verbalize grief and identify ways to connect in present AEB pt report and therapist observation.  Baseline date 05/20/24: Progress towards goal 0; How Often - Daily Target Date Goal Was reviewed Status Code Progress towards goal  05/20/25                This plan has been reviewed and created by the following participants:  This plan will be reviewed at least every 12 months. Date Behavioral Health Clinician Date Guardian/Patient   05/20/24 [facility]  05/20/24 Verbal Consent Provided and electronic consent requested                          BARBARANN APPL [facility]

## 2024-07-30 ENCOUNTER — Other Ambulatory Visit: Payer: Self-pay | Admitting: Psychiatry

## 2024-07-31 ENCOUNTER — Telehealth: Payer: Medicare (Managed Care) | Admitting: Psychiatry
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
## 2024-08-07 ENCOUNTER — Telehealth (INDEPENDENT_AMBULATORY_CARE_PROVIDER_SITE_OTHER): Payer: Medicare (Managed Care) | Admitting: Psychiatry

## 2024-08-07 ENCOUNTER — Encounter: Payer: Self-pay | Admitting: Psychiatry

## 2024-08-07 DIAGNOSIS — F330 Major depressive disorder, recurrent, mild: Secondary | ICD-10-CM | POA: Diagnosis not present

## 2024-08-07 MED ORDER — ARIPIPRAZOLE 2 MG PO TABS: 2.0000 mg | ORAL_TABLET | Freq: Every day | ORAL | 2 refills | Status: DC

## 2024-08-07 NOTE — Patient Instructions (Signed)
 Continue Abilify  2 mg daily Obtain lab- TSH at [facility] Next appointment: 1/28 at 8 20

## 2024-08-12 ENCOUNTER — Ambulatory Visit (INDEPENDENT_AMBULATORY_CARE_PROVIDER_SITE_OTHER): Payer: Medicare (Managed Care) | Admitting: Psychology

## 2024-08-12 DIAGNOSIS — F330 Major depressive disorder, recurrent, mild: Secondary | ICD-10-CM | POA: Diagnosis not present

## 2024-08-12 NOTE — Progress Notes (Signed)
 [facility] Counselor/Therapist Progress Note  Patient ID: Erica Webster, MRN: [id number],    Date: 08/12/2024  Time Spent: 2:30pm-2:56pm   Treatment Type: Individual Therapy  Pt is seen for a virtual video visit via caregility.   Pt joins from her apartment, reporting privacy, and counselor from her home office.  Pt consents to virtual visits and is aware of limitations of such visits.    Reported Symptoms: Pt reports mood has been happy. Pt reports looking forward to the holidays.  Mental Status Exam: Appearance:  Well Groomed     Behavior: Appropriate  Motor: Normal  Speech/Language:  Clear and Coherent and Normal Rate  Affect: Appropriate  Mood: normal  Thought process: normal  Thought content:   WNL  Sensory/Perceptual disturbances:   WNL  Orientation: oriented to person, place, time/date, and situation  Attention: Good  Concentration: Good  Memory: WNL  Fund of knowledge:  Good  Insight:   Good  Judgment:  Good  Impulse Control: Good   Risk Assessment: Danger to Self:  No Self-injurious Behavior: No Danger to Others: No Duty to Warn:no Physical Aggression / Violence:No  Access to Firearms a concern: No  Gang Involvement:No   Subjective: Counselor assessed pt current functioning per pt report.  Processed w/pt recent mood and reports of improvements.  Reflected on positives of setting boundary w/ peer.  Explored positive engagement  w/ her family and looking forward to holidays.  Discussed ways of connecting and sharing positive memories as part of her grief.  Pt affect wnl.  Pt reports she has been feeling happy.  Pt also reports she is sleeping well.  Pt reports that she is reflecting on a lot of positive memories of deceased loved ones and has been helpful. Pt discussed holidays upcoming and looking forward to gathering w/ family and spending time together.  Pt reports she is continuing w/ her walking.  Pt did report that had to set boundary w/ peer  that reconnected w/ her and eventually blocked as pushing boundary.    Interventions: Cognitive Behavioral Therapy, Assertiveness/Communication, and Mindfulness Meditation  Diagnosis:MDD (major depressive disorder), recurrent episode, mild  Plan: Pt to f/u w/ counseling every 4weeks.  Pt to f/u w/ Dr. Vickey as scheduled.  Pt to f/u w/ PCP as scheduled.  Individualized Treatment Plan Strengths: seeking counseling, walking,  enjoy window shopping, listen to music and watch t.v.  Supports: maternal aunt and maternal cousin- extended family.   Goal/Needs for Treatment:  In order of importance to patient 1) depression  2) grief 3) ---   Client Statement of Needs: I want to not take myself too seriously.  Think positively about my life and my health and not focus so much on the past.  I want to do more in my life and figure out what that might be.   Treatment Level:outpatient counseling  Symptoms:depression, grief, loneliness  Client Treatment Preferences:individual counseling,  virtual counseling every 2-3 weeks, work w/ Dr. Hisada for medication management.  Not seeking day tx program currently.   Healthcare consumer's goal for treatment:  Counselor, Damien Herald, [facility] will support the patient's ability to achieve the goals identified. Cognitive Behavioral Therapy, Assertive Communication/Conflict Resolution Training, Relaxation Training, ACT, Humanistic and other evidenced-based practices will be used to promote progress towards healthy functioning.   Healthcare consumer will: Actively participate in therapy, working towards healthy functioning.    *Justification for Continuation/Discontinuation of Goal: R=Revised, O=Ongoing, A=Achieved, D=Discontinued  Goal 1) Increase engagement w/ interests and supports, increase  use of grounding skills and maintain daily self care to assist coping and reducing depressive symptoms by pt report and therapist observation.  Baseline date 05/20/24:  Progress towards goal 0; How Often - Daily Target Date Goal Was reviewed Status Code Progress towards goal/Likert rating  05/20/25                Goal 2) give safe space to verbalize grief and identify ways to connect in present AEB pt report and therapist observation.  Baseline date 05/20/24: Progress towards goal 0; How Often - Daily Target Date Goal Was reviewed Status Code Progress towards goal  05/20/25                This plan has been reviewed and created by the following participants:  This plan will be reviewed at least every 12 months. Date Behavioral Health Clinician Date Guardian/Patient   05/20/24 [facility] Erica [facility]  05/20/24 Verbal Consent Provided and electronic consent requested                          Erica Webster

## 2024-08-21 ENCOUNTER — Ambulatory Visit: Payer: Medicare (Managed Care) | Admitting: Obstetrics & Gynecology

## 2024-08-21 VITALS — BP 123/86 | HR 97 | Ht 62.0 in | Wt 333.1 lb

## 2024-08-21 DIAGNOSIS — D259 Leiomyoma of uterus, unspecified: Secondary | ICD-10-CM | POA: Diagnosis not present

## 2024-08-21 DIAGNOSIS — N946 Dysmenorrhea, unspecified: Secondary | ICD-10-CM | POA: Diagnosis not present

## 2024-08-21 DIAGNOSIS — N920 Excessive and frequent menstruation with regular cycle: Secondary | ICD-10-CM

## 2024-08-21 DIAGNOSIS — D219 Benign neoplasm of connective and other soft tissue, unspecified: Secondary | ICD-10-CM

## 2024-08-21 NOTE — Progress Notes (Signed)
    GYNECOLOGY PROGRESS NOTE  Subjective:    Patient ID: Erica Webster, female    DOB: Feb 16, 1986, 38 y.o.   MRN: [id number]  HPI  Patient is a 38 y.o. single G0 here today to discuss her periods. She saw Dr. Janit 05/2024. At that time she declined hormonal and surgical management of her periods. She has pain with her periods. Her periods are also very heavy. She has to wear 2 pads at a time. She generally bleeds about 7 days per month.  She had a pelvic ultrasound about a year ago. It showed the following: Measurements: 9.1 x 6.1 x 5.9 cm = volume: 172.4 mL. There is a 7 x 6.4 x 6.5 cm mass in the posterior fundal myometrium. This is probably a uterine fibroid. This is seen on prior CT.  TSH, CBC and ferritin were ordered in the past but she never had it drawn. On previous tests up to 4 years ago she has mild anemia.   The following portions of the patient's history were reviewed and updated as appropriate: allergies, current medications, past family history, past medical history, past social history, past surgical history, and problem list.  Review of Systems Pertinent items are noted in HPI.  She reports a pap smear about 3 years ago with her Primary care provider. She denies a h/o abnormal pap smears. She is virginal.   Objective:   Blood pressure 123/86, pulse 97, height 5' 2 (1.575 m), weight (!) 333 lb 1.6 oz (151.1 kg), last menstrual period 07/26/2024. Body mass index is 60.92 kg/m. Well nourished, well hydrated  female, no apparent distress She is ambulating and conversing normally.   Assessment:   Fibroids, heavy painful periods  Plan:  Options reviewed- surgery, hormonal management, UAE She opts to discuss UAE with radiologist CBC, TSH, and ferritin will be drawn today

## 2024-08-22 ENCOUNTER — Ambulatory Visit: Payer: Self-pay | Admitting: Psychiatry

## 2024-08-22 LAB — CBC
Hematocrit: 38.9 % (ref 34.0–46.6)
Hemoglobin: 11.7 g/dL (ref 11.1–15.9)
MCH: 24.2 pg — ABNORMAL LOW (ref 26.6–33.0)
MCHC: 30.1 g/dL — ABNORMAL LOW (ref 31.5–35.7)
MCV: 81 fL (ref 79–97)
Platelets: 326 x10E3/uL (ref 150–450)
RBC: 4.83 x10E6/uL (ref 3.77–5.28)
RDW: 16 % — ABNORMAL HIGH (ref 11.7–15.4)
WBC: 4.5 x10E3/uL (ref 3.4–10.8)

## 2024-08-22 LAB — FERRITIN: Ferritin: 17 ng/mL (ref 15–150)

## 2024-08-22 LAB — TSH: TSH: 2.07 u[IU]/mL (ref 0.450–4.500)

## 2024-08-22 NOTE — Progress Notes (Signed)
 Spoke to patient inform her of lab results advised her to follow up with her primary care physician patient voiced understanding

## 2024-08-22 NOTE — Telephone Encounter (Signed)
 I have reviewed labs dated 08/21/2024-TSH-within normal limits-2.070 CBC -abnormal-chronic. Ferritin level at 17 ng/mL-borderline low range. Will have patient consult with primary care provider for any recommendations if needed.

## 2024-08-27 ENCOUNTER — Telehealth: Payer: Self-pay

## 2024-08-27 NOTE — Telephone Encounter (Addendum)
  Pt states that her meds were not ready for pick up, called and spoke with pharmacist. They state a hold was put up but it is now clear. LVM to patient regarding medication pickup.

## 2024-08-28 ENCOUNTER — Other Ambulatory Visit: Payer: Self-pay | Admitting: Obstetrics & Gynecology

## 2024-08-28 ENCOUNTER — Other Ambulatory Visit: Payer: Self-pay | Admitting: Interventional Radiology

## 2024-08-28 DIAGNOSIS — D259 Leiomyoma of uterus, unspecified: Secondary | ICD-10-CM

## 2024-09-04 ENCOUNTER — Telehealth: Payer: Self-pay

## 2024-09-04 NOTE — Telephone Encounter (Signed)
 Patient would like for you to call her. She said she has been having some lower abdominal pain and cramping since last night. She has been taking Aleve for pain with no relief. She also has been having night sweats. Her LMP was 08/22/24. She has an appointment to see radiologist on next Thursday and again on Dec. 18. I told patient that you were not available until 12/09. She verbalized understanding.

## 2024-09-11 ENCOUNTER — Ambulatory Visit: Payer: Medicare (Managed Care) | Admitting: Psychology

## 2024-09-11 DIAGNOSIS — F330 Major depressive disorder, recurrent, mild: Secondary | ICD-10-CM

## 2024-09-11 NOTE — Progress Notes (Signed)
 [facility] Counselor/Therapist Progress Note  Patient ID: Erica Webster, MRN: [id number],    Date: 09/11/2024  Time Spent: 2:42pm-3:12pm   Treatment Type: Individual Therapy  Pt is seen for a virtual video visit via caregility.   Pt joins from her apartment, reporting privacy, and counselor from her home office.  Pt consents to virtual visits and is aware of limitations of such visits.    Reported Symptoms: Pt reports mood has been good.  Pt reports she has been feeling worn out and fatigued.  Pt reports sleep schedule off.  Mental Status Exam: Appearance:  Well Groomed     Behavior: Appropriate  Motor: Normal  Speech/Language:  Clear and Coherent and Normal Rate  Affect: Appropriate  Mood: overwhelmed  Thought process: normal  Thought content:   WNL  Sensory/Perceptual disturbances:   WNL  Orientation: oriented to person, place, time/date, and situation  Attention: Good  Concentration: Good  Memory: WNL  Fund of knowledge:  Good  Insight:   Good  Judgment:  Good  Impulse Control: Good   Risk Assessment: Danger to Self:  No Self-injurious Behavior: No Danger to Others: No Duty to Warn:no Physical Aggression / Violence:No  Access to Firearms a concern: No  Gang Involvement:No   Subjective: Counselor assessed pt current functioning per pt report.  Processed w/pt recent fatigue and sleep scheduled.  Explored contributing factors w/ pt and how working w/ her providers to resolve.  Discussed her holiday and how decided to spend alone and how was able to assert what wanted.  Assisted pt w/ identifying routines to assist self w/ not feeing overwhelmed and establish good sleep hygiene.   Pt affect wnl.  Pt reports she has been feeling fatigued and out of it.  Pt reports she did stay up late last night getting a lot of household chores done that had been procrastinating on.  Pt reports minimal sleep last night and trying to stay awake until bedtime tonight/8pm. Pt  reports she has been dx w/ anemia and aware that is impacting things. Pt reports she hasn't been feeling depressed.  Pt reports she decided to stay home for holidays and enjoy quiet.  Pt reports family was concerned but she wasn't feeling down or withdrawing.  Pt enjoyed her time to self and plans for christmas w/ family.   Interventions: Cognitive Behavioral Therapy, Assertiveness/Communication, and Mindfulness Meditation  Diagnosis:MDD (major depressive disorder), recurrent episode, mild  Plan: Pt to f/u w/ counseling every 4weeks.  Pt to f/u w/ Dr. Vickey as scheduled.  Pt to f/u w/ PCP as scheduled.  Individualized Treatment Plan Strengths: seeking counseling, walking,  enjoy window shopping, listen to music and watch t.v.  Supports: maternal aunt and maternal cousin- extended family.   Goal/Needs for Treatment:  In order of importance to patient 1) depression  2) grief 3) ---   Client Statement of Needs: I want to not take myself too seriously.  Think positively about my life and my health and not focus so much on the past.  I want to do more in my life and figure out what that might be.   Treatment Level:outpatient counseling  Symptoms:depression, grief, loneliness  Client Treatment Preferences:individual counseling,  virtual counseling every 2-3 weeks, work w/ Dr. Hisada for medication management.  Not seeking day tx program currently.   Healthcare consumer's goal for treatment:  Counselor, Damien Herald, [facility] will support the patient's ability to achieve the goals identified. Cognitive Behavioral Therapy, Assertive Communication/Conflict Resolution Training, Management Consultant,  ACT, Humanistic and other evidenced-based practices will be used to promote progress towards healthy functioning.   Healthcare consumer will: Actively participate in therapy, working towards healthy functioning.    *Justification for Continuation/Discontinuation of Goal: R=Revised, O=Ongoing,  A=Achieved, D=Discontinued  Goal 1) Increase engagement w/ interests and supports, increase use of grounding skills and maintain daily self care to assist coping and reducing depressive symptoms by pt report and therapist observation.  Baseline date 05/20/24: Progress towards goal 0; How Often - Daily Target Date Goal Was reviewed Status Code Progress towards goal/Likert rating  05/20/25                Goal 2) give safe space to verbalize grief and identify ways to connect in present AEB pt report and therapist observation.  Baseline date 05/20/24: Progress towards goal 0; How Often - Daily Target Date Goal Was reviewed Status Code Progress towards goal  05/20/25                This plan has been reviewed and created by the following participants:  This plan will be reviewed at least every 12 months. Date Behavioral Health Clinician Date Guardian/Patient   05/20/24 [facility] Barbarann [facility]  05/20/24 Verbal Consent Provided and electronic consent requested                          BARBARANN APPL [facility]

## 2024-09-12 ENCOUNTER — Inpatient Hospital Stay: Admission: RE | Admit: 2024-09-12 | Payer: Medicare (Managed Care)

## 2024-09-19 ENCOUNTER — Other Ambulatory Visit: Payer: Medicare (Managed Care)

## 2024-10-14 ENCOUNTER — Ambulatory Visit
Admission: RE | Admit: 2024-10-14 | Discharge: 2024-10-14 | Disposition: A | Discharge status: Home / Self Care | Payer: Medicare (Managed Care) | Source: Ambulatory Visit | Attending: Interventional Radiology | Admitting: Interventional Radiology

## 2024-10-14 DIAGNOSIS — D259 Leiomyoma of uterus, unspecified: Secondary | ICD-10-CM

## 2024-10-15 ENCOUNTER — Encounter: Payer: Self-pay | Admitting: Student in an Organized Health Care Education/Training Program

## 2024-10-15 ENCOUNTER — Ambulatory Visit: Payer: Medicare (Managed Care) | Admitting: Student in an Organized Health Care Education/Training Program

## 2024-10-15 VITALS — BP 120/78 | HR 100 | Temp 98.1°F | Ht 62.0 in | Wt 332.8 lb

## 2024-10-15 DIAGNOSIS — J4520 Mild intermittent asthma, uncomplicated: Secondary | ICD-10-CM | POA: Diagnosis not present

## 2024-10-15 DIAGNOSIS — Z6841 Body Mass Index (BMI) 40.0 and over, adult: Secondary | ICD-10-CM | POA: Diagnosis not present

## 2024-10-15 MED ORDER — ALBUTEROL SULFATE HFA 108 (90 BASE) MCG/ACT IN AERS: 2.0000 | INHALATION_SPRAY | RESPIRATORY_TRACT | 11 refills | Status: AC | PRN

## 2024-10-15 MED ORDER — BREO ELLIPTA 100-25 MCG/ACT IN AEPB: 1.0000 | INHALATION_SPRAY | Freq: Every day | RESPIRATORY_TRACT | 11 refills | Status: AC

## 2024-10-15 NOTE — Patient Instructions (Signed)
" °  VISIT SUMMARY: During your visit, we discussed your asthma, obstructive sleep apnea, and weight management. You have been managing your asthma well with your current medications, and we reviewed your recent experience with shortness of breath. We also talked about your ongoing efforts to lose weight and the dietary changes you are making.  YOUR PLAN: -MILD INTERMITTENT ASTHMA: Mild intermittent asthma is a condition where you experience occasional episodes of wheezing and shortness of breath. You should continue using your Breo inhaler once daily and use your rescue inhaler as needed for any wheezing or shortness of breath.  -MORBID OBESITY: Morbid obesity is a condition where excess body weight contributes to health problems. It is important to manage your weight to improve your overall health. Continue taking tirzepatide for weight loss and implement dietary changes to reduce carbohydrate intake. Focus on eating more vegetables, fruits, and proteins such as fish and chicken.     Contains text generated by [employer]bridge.   "

## 2024-10-15 NOTE — Progress Notes (Unsigned)
 " Assessment & Plan  #Mild intermittent asthma  History of mild intermittent asthma, maintained on ICS/LABA with Breo Ellipta . PFT's performed during a prior visit showed only a very minimal obstructive defect, and was suggestive of restriction (no lung volumes were performed). ERV was notably low at 2% of predicted, which is consistent with her morbid obesity.  - Continue Breo inhaler once daily. - Use rescue inhaler as needed for wheezing or shortness of breath. - albuterol  (VENTOLIN  HFA) 108 (90 Base) MCG/ACT inhaler; Inhale 2 puffs into the lungs every 4 (four) hours as needed for wheezing or shortness of breath.  Dispense: 6.7 g; Refill: 11 - BREO ELLIPTA  100-25 MCG/ACT AEPB; Inhale 1 puff into the lungs daily.  Dispense: 30 each; Refill: 11  #Morbid obesity  Contributing to respiratory issues. Weight management crucial. Tirzepatide effective for weight loss. Dietary modifications discussed.  - Continue tirzepatide for weight loss. - Implement dietary changes to reduce carbohydrate intake, including cutting out bread, pasta, rice, potatoes, plantains, processed foods, cereal, crackers, cookies, and potato chips. - Increase intake of vegetables, fruits, and proteins such as fish and chicken.    Return in about 1 year (around 10/15/2025).  Belva November, MD  Pulmonary Critical Care  I spent 30 minutes caring for this patient today, including preparing to see the patient, obtaining a medical history , reviewing a separately obtained history, performing a medically appropriate examination and/or evaluation, counseling and educating the patient/family/caregiver, ordering medications, tests, or procedures, documenting clinical information in the electronic health record, and independently interpreting results (not separately reported/billed) and communicating results to the patient/family/caregiver  End of visit medications:  Meds ordered this encounter  Medications   albuterol   (VENTOLIN  HFA) 108 (90 Base) MCG/ACT inhaler    Sig: Inhale 2 puffs into the lungs every 4 (four) hours as needed for wheezing or shortness of breath.    Dispense:  6.7 g    Refill:  11   BREO ELLIPTA  100-25 MCG/ACT AEPB    Sig: Inhale 1 puff into the lungs daily.    Dispense:  30 each    Refill:  11    Current Medications[1]   Subjective:   PATIENT ID: Erica Webster GENDER: female DOB: 1985/10/11, MRN: [id number]  Chief Complaint  Patient presents with   Asthma    DOE. No wheezing. Cough, dry.  Did have a cold last week. Symbicort - 2 puffs bid, helps with her breathing. Albuterol  inhaler- prn. Albuterol  neb- prn.    HPI  Discussed the use of AI scribe software for clinical note transcription with the patient, who gave verbal consent to proceed.  History of Present Illness Erica Webster is a 39 year old female with mild intermittent asthma and obstructive sleep apnea who presents for follow-up.  Return Visit 09/22/2023:  She's done quite well since our last visit. Symptoms have been minimal and excellently controlled. She continues to have exertional dyspnea, but otherwise denies cough or chest tightness. She is compliant with her Symbicort  inhaler. No fevers, chills, night sweats, or rashes are reported. This is similar to her symptoms in June, when she had a very brief episode of similar symptoms that included shortness of breath and chest tightness. Patient continues to be compliant with her CPAP.    She reports living in an apartment that she moved into not long ago. She does report a weird smell in the apartment which improved with scents. She has not had any recent prednisone . She is a non-smoker and is on disability.  Return Visit 10/15/2024:  She recently experienced a cold, for which she took DayQuil and Flonase, leading to symptom resolution.  She was switched to Breo which she is tolerating well. She uses Breo once daily for asthma management and has a rescue  inhaler for acute symptoms. She experienced shortness of breath after climbing three flights of stairs twice in one day, which was relieved by using her rescue inhaler. She has no issues obtaining her inhalers, which are delivered from [employer].  She is on CPAP therapy for obstructive sleep apnea.  She is actively trying to lose weight, noting a reduction in portion sizes during the holidays. She is making dietary changes to support weight loss and is on tirzepatide for weight management, prescribed by her primary care doctor.   Ancillary information including prior medications, full medical/surgical/family/social histories, and PFTs (when available) are listed below and have been reviewed.   Review of Systems  Constitutional:  Negative for chills and fever.  Respiratory:  Negative for cough, hemoptysis, sputum production, shortness of breath and wheezing.   Cardiovascular:  Negative for chest pain.     Objective:   Vitals:   10/15/24 1340  BP: 120/78  Pulse: 100  Temp: 98.1 F (36.7 C)  SpO2: 99%  Weight: (!) 332 lb 12.8 oz (151 kg)  Height: 5' 2 (1.575 m)   99% on RA  BMI Readings from Last 3 Encounters:  10/15/24 60.87 kg/m  09/11/24 60.72 kg/m  08/21/24 60.92 kg/m   Wt Readings from Last 3 Encounters:  10/15/24 (!) 332 lb 12.8 oz (151 kg)  09/11/24 (!) 332 lb (150.6 kg)  08/21/24 (!) 333 lb 1.6 oz (151.1 kg)    Physical Exam Constitutional:      Appearance: She is obese. She is not ill-appearing.  Cardiovascular:     Rate and Rhythm: Normal rate and regular rhythm.     Pulses: Normal pulses.     Heart sounds: Normal heart sounds.  Pulmonary:     Effort: Pulmonary effort is normal. No respiratory distress.     Breath sounds: Normal breath sounds. No wheezing or rales.  Neurological:     General: No focal deficit present.     Mental Status: She is alert and oriented to person, place, and time. Mental status is at baseline.       Ancillary Information     Past Medical History:  Diagnosis Date   Anxiety    Asthma    Depression    Dyspnea    Sleep apnea      Family History  Adopted: Yes  Problem Relation Age of Onset   Breast cancer Neg Hx      Past Surgical History:  Procedure Laterality Date   CHOLECYSTECTOMY     COLONOSCOPY WITH PROPOFOL  N/A 03/12/2021   Procedure: COLONOSCOPY WITH PROPOFOL ;  Surgeon: Maryruth Ole DASEN, MD;  Location: [facility] ENDOSCOPY;  Service: Endoscopy;  Laterality: N/A;   ESOPHAGOGASTRODUODENOSCOPY (EGD) WITH PROPOFOL  N/A 03/12/2021   Procedure: ESOPHAGOGASTRODUODENOSCOPY (EGD) WITH PROPOFOL ;  Surgeon: Maryruth Ole DASEN, MD;  Location: [facility] ENDOSCOPY;  Service: Endoscopy;  Laterality: N/A;   TOENAIL EXCISION      Social History   Socioeconomic History   Marital status: Single    Spouse name: Not on file   Number of children: Not on file   Years of education: Not on file   Highest education level: Not on file  Occupational History   Not on file  Tobacco Use   Smoking status: Never  Smokeless tobacco: Never  Vaping Use   Vaping status: Never Used  Substance and Sexual Activity   Alcohol use: No   Drug use: No   Sexual activity: Not Currently    Birth control/protection: None  Other Topics Concern   Not on file  Social History Narrative   Not on file   Social Drivers of Health   Tobacco Use: Low Risk (10/15/2024)   Patient History    Smoking Tobacco Use: Never    Smokeless Tobacco Use: Never    Passive Exposure: Not on file  Financial Resource Strain: Not on file  Food Insecurity: Not on file  Transportation Needs: Not on file  Physical Activity: Not on file  Stress: Not on file  Social Connections: Not on file  Intimate Partner Violence: Not on file  Depression (PHQ2-9): Medium Risk (12/22/2022)   Depression (PHQ2-9)    PHQ-2 Score: 10  Alcohol Screen: Not on file  Housing: Not on file  Utilities: Not on file  Health Literacy: Not on file     Allergies[2]   CBC     Component Value Date/Time   WBC 4.5 08/21/2024 1512   WBC 8.1 04/07/2022 1046   RBC 4.83 08/21/2024 1512   RBC 4.52 04/07/2022 1046   HGB 11.7 08/21/2024 1512   HCT 38.9 08/21/2024 1512   PLT 326 08/21/2024 1512   MCV 81 08/21/2024 1512   MCH 24.2 (L) 08/21/2024 1512   MCH 22.8 (L) 04/07/2022 1046   MCHC 30.1 (L) 08/21/2024 1512   MCHC 28.3 (L) 04/07/2022 1046   RDW 16.0 (H) 08/21/2024 1512   LYMPHSABS 2.3 04/07/2022 1046   MONOABS 0.4 04/07/2022 1046   EOSABS 0.1 04/07/2022 1046   BASOSABS 0.1 04/07/2022 1046    Pulmonary Functions Testing Results:    Latest Ref Rng & Units 01/17/2023    3:27 PM  PFT Results  FVC-Pre L 2.28   FVC-Predicted Pre % 65   FVC-Post L 2.29   FVC-Predicted Post % 66   Pre FEV1/FVC % % 82   Post FEV1/FCV % % 87   FEV1-Pre L 1.86   FEV1-Predicted Pre % 65   FEV1-Post L 1.98   DLCO uncorrected ml/min/mmHg 18.32   DLCO UNC% % 89   DLVA Predicted % 132     Outpatient Medications Prior to Visit  Medication Sig Dispense Refill   albuterol  (PROVENTIL ) (2.5 MG/3ML) 0.083% nebulizer solution USE 1 VIAL IN NEBULIZER EVERY 4 HOURS AS NEEDED 165 mL 0   ARIPiprazole  (ABILIFY ) 2 MG tablet Take 1 tablet (2 mg total) by mouth at bedtime. 30 tablet 2   Cholecalciferol (VITAMIN D3) 50 MCG (2000 UT) TABS Take 2,000 Units by mouth daily.     ferrous gluconate (FERGON) 324 MG tablet Take 324 mg by mouth daily with breakfast.     ferrous gluconate (FERGON) 324 MG tablet Take 324 mg by mouth daily with breakfast.     Polyethylene Glycol 3350 POWD Take one capful mixed in 8 oz water three times per week, increase to daily for soft, easy to pass stools if necessary     polyethylene glycol powder (GLYCOLAX/MIRALAX) 17 GM/SCOOP powder Take 17 g by mouth daily.     PREPARATION H 1-0.25-14.4-15 % CREA SMARTSIG:sparingly Twice Daily     Spacer/Aero Chamber Mouthpiece MISC 1 Units by Does not apply route every 4 (four) hours as needed (wheezing). 1 each 0   triamcinolone  cream (KENALOG) 0.1 % Apply 1 Application topically as needed.  ZEPBOUND 5 MG/0.5ML Pen      albuterol  (PROVENTIL  HFA;VENTOLIN  HFA) 108 (90 Base) MCG/ACT inhaler Inhale 2 puffs into the lungs every 4 (four) hours as needed for wheezing or shortness of breath.     BREO ELLIPTA  100-25 MCG/ACT AEPB Inhale 1 puff into the lungs daily.     budesonide -formoterol  (SYMBICORT ) 160-4.5 MCG/ACT inhaler Inhale 2 puffs into the lungs 2 (two) times daily. 3 each 11   No facility-administered medications prior to visit.      [1]  Current Outpatient Medications:    albuterol  (PROVENTIL ) (2.5 MG/3ML) 0.083% nebulizer solution, USE 1 VIAL IN NEBULIZER EVERY 4 HOURS AS NEEDED, Disp: 165 mL, Rfl: 0   ARIPiprazole  (ABILIFY ) 2 MG tablet, Take 1 tablet (2 mg total) by mouth at bedtime., Disp: 30 tablet, Rfl: 2   Cholecalciferol (VITAMIN D3) 50 MCG (2000 UT) TABS, Take 2,000 Units by mouth daily., Disp: , Rfl:    ferrous gluconate (FERGON) 324 MG tablet, Take 324 mg by mouth daily with breakfast., Disp: , Rfl:    ferrous gluconate (FERGON) 324 MG tablet, Take 324 mg by mouth daily with breakfast., Disp: , Rfl:    Polyethylene Glycol 3350 POWD, Take one capful mixed in 8 oz water three times per week, increase to daily for soft, easy to pass stools if necessary, Disp: , Rfl:    polyethylene glycol powder (GLYCOLAX/MIRALAX) 17 GM/SCOOP powder, Take 17 g by mouth daily., Disp: , Rfl:    PREPARATION H 1-0.25-14.4-15 % CREA, SMARTSIG:sparingly Twice Daily, Disp: , Rfl:    Spacer/Aero Chamber Mouthpiece MISC, 1 Units by Does not apply route every 4 (four) hours as needed (wheezing)., Disp: 1 each, Rfl: 0   triamcinolone cream (KENALOG) 0.1 %, Apply 1 Application topically as needed., Disp: , Rfl:    ZEPBOUND 5 MG/0.5ML Pen, , Disp: , Rfl:    albuterol  (VENTOLIN  HFA) 108 (90 Base) MCG/ACT inhaler, Inhale 2 puffs into the lungs every 4 (four) hours as needed for wheezing or shortness of breath., Disp: 6.7 g, Rfl: 11    BREO ELLIPTA  100-25 MCG/ACT AEPB, Inhale 1 puff into the lungs daily., Disp: 30 each, Rfl: 11 [2]  Allergies Allergen Reactions   Shellfish Allergy Anaphylaxis   Penicillins Itching   Ferrous Sulfate Rash   Other Palpitations    Steroids   "

## 2024-10-17 ENCOUNTER — Other Ambulatory Visit: Payer: Medicare (Managed Care)

## 2024-10-22 ENCOUNTER — Ambulatory Visit: Payer: Medicare (Managed Care) | Admitting: Psychology
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
## 2024-10-29 ENCOUNTER — Ambulatory Visit: Payer: Medicare (Managed Care) | Admitting: Psychology

## 2024-10-29 DIAGNOSIS — F330 Major depressive disorder, recurrent, mild: Secondary | ICD-10-CM | POA: Diagnosis not present

## 2024-10-29 NOTE — Progress Notes (Signed)
 "       [facility] Counselor/Therapist Progress Note  Patient ID: Erica Webster, MRN: [id number],    Date: 10/29/2024  Time Spent: 3:30pm-3:56pm   Treatment Type: Individual Therapy  Pt is seen for a virtual video visit via caregility.   Pt joins from her apartment, reporting privacy, and counselor from her home office.  Pt consents to virtual visits and is aware of limitations of such visits.    Reported Symptoms: Pt reports mood has been good.  Pt reports she enjoyed her time w/ family for Christams and birthday.  Mental Status Exam: Appearance:  Well Groomed     Behavior: Appropriate  Motor: Normal  Speech/Language:  Clear and Coherent and Normal Rate  Affect: Appropriate  Mood: normal  Thought process: normal  Thought content:   WNL  Sensory/Perceptual disturbances:   WNL  Orientation: oriented to person, place, time/date, and situation  Attention: Good  Concentration: Good  Memory: WNL  Fund of knowledge:  Good  Insight:   Good  Judgment:  Good  Impulse Control: Good   Risk Assessment: Danger to Self:  No Self-injurious Behavior: No Danger to Others: No Duty to Warn:no Physical Aggression / Violence:No  Access to Firearms a concern: No  Gang Involvement:No   Subjective: Counselor assessed pt current functioning per pt report.  Processed w/pt recent positives and stressors. Explored interactions w/ family for holidays and her birthday.  Reflected positive engagement.  Encouraged to continue movement and getting outside when weather favorable. Discussed positive of f/u w/ GYN for severe pain/bleeding on cycles.   Pt affect wnl.  Pt reports she had a great time w/ family for Christmas.  Pt rpeorts staying for 1/5 week w/ aunt.  Pt reports then family  took out to dinner for her birthday.  Pt reports that her mood is good- not depressed.  Pt reports she has been continuing to struggle w/ severe pain w/ her cycle.  Pt reports she has an MRI this week to f/u  on fibroid and see if any other issues.  Pt feels positive about getting checked out.   Interventions: Cognitive Behavioral Therapy, Assertiveness/Communication, and Mindfulness Meditation  Diagnosis:MDD (major depressive disorder), recurrent episode, mild  Plan: Pt to f/u w/ counseling every 4weeks.  Pt to f/u w/ Dr. Vickey as scheduled.  Pt to f/u w/ PCP as scheduled.  Individualized Treatment Plan Strengths: seeking counseling, walking,  enjoy window shopping, listen to music and watch t.v.  Supports: maternal aunt and maternal cousin- extended family.   Goal/Needs for Treatment:  In order of importance to patient 1) depression  2) grief 3) ---   Client Statement of Needs: I want to not take myself too seriously.  Think positively about my life and my health and not focus so much on the past.  I want to do more in my life and figure out what that might be.   Treatment Level:outpatient counseling  Symptoms:depression, grief, loneliness  Client Treatment Preferences:individual counseling,  virtual counseling every 2-3 weeks, work w/ Dr. Hisada for medication management.  Not seeking day tx program currently.   Healthcare consumer's goal for treatment:  Counselor, Damien Herald, [facility] will support the patient's ability to achieve the goals identified. Cognitive Behavioral Therapy, Assertive Communication/Conflict Resolution Training, Relaxation Training, ACT, Humanistic and other evidenced-based practices will be used to promote progress towards healthy functioning.   Healthcare consumer will: Actively participate in therapy, working towards healthy functioning.    *Justification for Continuation/Discontinuation of Goal:  R=Revised, O=Ongoing, A=Achieved, D=Discontinued  Goal 1) Increase engagement w/ interests and supports, increase use of grounding skills and maintain daily self care to assist coping and reducing depressive symptoms by pt report and therapist observation.  Baseline  date 05/20/24: Progress towards goal 0; How Often - Daily Target Date Goal Was reviewed Status Code Progress towards goal/Likert rating  05/20/25                Goal 2) give safe space to verbalize grief and identify ways to connect in present AEB pt report and therapist observation.  Baseline date 05/20/24: Progress towards goal 0; How Often - Daily Target Date Goal Was reviewed Status Code Progress towards goal  05/20/25                This plan has been reviewed and created by the following participants:  This plan will be reviewed at least every 12 months. Date Behavioral Health Clinician Date Guardian/Patient   05/20/24 [facility]  05/20/24 Verbal Consent Provided and electronic consent requested                          BARBARANN APPL [facility]          [place], [facility] "

## 2024-10-30 ENCOUNTER — Encounter: Payer: Self-pay | Admitting: Psychiatry

## 2024-10-30 ENCOUNTER — Telehealth: Payer: Medicare (Managed Care) | Admitting: Psychiatry

## 2024-10-30 DIAGNOSIS — G4700 Insomnia, unspecified: Secondary | ICD-10-CM | POA: Diagnosis not present

## 2024-10-30 DIAGNOSIS — F3341 Major depressive disorder, recurrent, in partial remission: Secondary | ICD-10-CM

## 2024-10-30 MED ORDER — ARIPIPRAZOLE 2 MG PO TABS: 2.0000 mg | ORAL_TABLET | Freq: Every day | ORAL | 3 refills | Status: AC

## 2024-10-30 NOTE — Patient Instructions (Signed)
 Continue Abilify  2 mg daily Next appointment: 3/13 at 8 am

## 2024-10-31 ENCOUNTER — Ambulatory Visit
Admission: RE | Admit: 2024-10-31 | Discharge: 2024-10-31 | Disposition: A | Discharge status: Home / Self Care | Payer: Medicare (Managed Care) | Source: Ambulatory Visit | Attending: Interventional Radiology | Admitting: Interventional Radiology

## 2024-10-31 MED ORDER — GADOPICLENOL 0.5 MMOL/ML IV SOLN
10.0000 mL | Freq: Once | INTRAVENOUS | Status: AC | PRN
  Administered 2024-10-31: 10 mL via INTRAVENOUS

## 2024-11-07 ENCOUNTER — Other Ambulatory Visit: Payer: Medicare (Managed Care)

## 2024-11-07 ENCOUNTER — Inpatient Hospital Stay
Admission: RE | Admit: 2024-11-07 | Discharge: 2024-11-07 | Disposition: A | Payer: Medicare (Managed Care) | Source: Ambulatory Visit | Attending: Obstetrics & Gynecology | Admitting: Obstetrics & Gynecology

## 2024-11-07 DIAGNOSIS — D259 Leiomyoma of uterus, unspecified: Secondary | ICD-10-CM

## 2024-11-07 NOTE — Consult Note (Addendum)
 "      Chief Complaint: Patient was seen in consultation today for menorrhagia and dysmenorrhea at the request of Erica Webster  Referring Physician(s): Erica Webster  History of Present Illness: Erica Webster is a 39 y.o. female With a past medical history significant for morbid obesity as well as severe menorrhagia and dysmenorrhea.  Her cycles occur regularly and last for approximately 5 to 6 days.  She requires use of superabsorbent pads changed as frequently as every 15 minutes during the heaviest days.  Additionally, her cycles are severe and debilitating.  She notes that it is difficult to manage her everyday life when she is on her cycle due to the heaviness and volume of the bleeding and how frequently she needs to attend to these issues.  Her last Pap smear was negative.  MRI confirms a large intramural fibroid in the posterior uterine body.  Her uterine fibroid symptom severity score was 69 out of 100.  Her health-related quality of life score was 29 out of 100.  Past Medical History:  Diagnosis Date   Anxiety    Asthma    Depression    Dyspnea    Sleep apnea     Past Surgical History:  Procedure Laterality Date   CHOLECYSTECTOMY     COLONOSCOPY WITH PROPOFOL  N/A 03/12/2021   Procedure: COLONOSCOPY WITH PROPOFOL ;  Surgeon: Maryruth Ole DASEN, MD;  Location: [facility];  Service: Endoscopy;  Laterality: N/A;   ESOPHAGOGASTRODUODENOSCOPY (EGD) WITH PROPOFOL  N/A 03/12/2021   Procedure: ESOPHAGOGASTRODUODENOSCOPY (EGD) WITH PROPOFOL ;  Surgeon: Maryruth Ole DASEN, MD;  Location: [facility];  Service: Endoscopy;  Laterality: N/A;   IR RADIOLOGIST EVAL & MGMT  11/07/2024   TOENAIL EXCISION      Allergies: Shellfish allergy, Penicillins, Ferrous sulfate, and Other  Medications: Prior to Admission medications  Medication Sig Start Date End Date Taking? Authorizing Provider  albuterol  (PROVENTIL ) (2.5 MG/3ML) 0.083% nebulizer solution USE 1 VIAL IN NEBULIZER EVERY 4  HOURS AS NEEDED 04/03/24  Yes Dgayli, Belva, MD  albuterol  (VENTOLIN  HFA) 108 (90 Base) MCG/ACT inhaler Inhale 2 puffs into the lungs every 4 (four) hours as needed for wheezing or shortness of breath. 10/15/24  Yes Dgayli, Belva, MD  ARIPiprazole  (ABILIFY ) 2 MG tablet Take 1 tablet (2 mg total) by mouth at bedtime. 11/05/24 03/05/25 Yes Vickey Mettle, MD  BREO ELLIPTA  100-25 MCG/ACT AEPB Inhale 1 puff into the lungs daily. 10/15/24  Yes Dgayli, Belva, MD  cetirizine (ZYRTEC) 10 MG chewable tablet Chew 10 mg by mouth daily.   Yes [provider]  Cholecalciferol (VITAMIN D3) 50 MCG (2000 UT) TABS Take 2,000 Units by mouth daily.   Yes [provider]  ferrous gluconate (FERGON) 324 MG tablet Take 324 mg by mouth daily with breakfast.   Yes [provider]  ferrous gluconate (FERGON) 324 MG tablet Take 324 mg by mouth daily with breakfast.   Yes [provider]  Polyethylene Glycol 3350 POWD Take one capful mixed in 8 oz water three times per week, increase to daily for soft, easy to pass stools if necessary 08/20/24  Yes [provider]  polyethylene glycol powder (GLYCOLAX/MIRALAX) 17 GM/SCOOP powder Take 17 g by mouth daily. 08/20/24  Yes [provider]  Spacer/Aero Chamber Mouthpiece MISC 1 Units by Does not apply route every 4 (four) hours as needed (wheezing). 10/30/17  Yes Judd Betters, MD  triamcinolone cream (KENALOG) 0.1 % Apply 1 Application topically as needed. 05/17/23  Yes [provider]  ZEPBOUND 5 MG/0.5ML Pen  08/20/24  Yes [provider]  PREPARATION H 1-0.25-14.4-15 % CREA SMARTSIG:sparingly Twice Daily Patient not taking: Reported on 11/07/2024 08/20/24   [provider]     Family History  Adopted: Yes  Problem Relation Age of Onset   Breast cancer Neg Hx     Social History   Socioeconomic History   Marital status: Single    Spouse name: Not on file   Number of children: Not on file   Years  of education: Not on file   Highest education level: Not on file  Occupational History   Not on file  Tobacco Use   Smoking status: Never   Smokeless tobacco: Never  Vaping Use   Vaping status: Never Used  Substance and Sexual Activity   Alcohol use: No   Drug use: No   Sexual activity: Not Currently    Birth control/protection: None  Other Topics Concern   Not on file  Social History Narrative   Not on file   Social Drivers of Health   Tobacco Use: Low Risk (10/30/2024)   Patient History    Smoking Tobacco Use: Never    Smokeless Tobacco Use: Never    Passive Exposure: Not on file  Financial Resource Strain: Not on file  Food Insecurity: Not on file  Transportation Needs: Not on file  Physical Activity: Not on file  Stress: Not on file  Social Connections: Not on file  Depression (PHQ2-9): Medium Risk (12/22/2022)   Depression (PHQ2-9)    PHQ-2 Score: 10  Alcohol Screen: Not on file  Housing: Not on file  Utilities: Not on file  Health Literacy: Not on file    Review of Systems: A 12 point ROS discussed and pertinent positives are indicated in the HPI above.  All other systems are negative.  Review of Systems  Vital Signs: BP (!) 152/94 (BP Location: Left Arm, Patient Position: Sitting, Cuff Size: Large)   Pulse (!) 107   Temp 99 F (37.2 Webster) (Oral)   Resp 16   Wt (!) 170.1 kg   SpO2 97%   BMI 68.59 kg/m     Physical Exam Constitutional:      General: She is not in acute distress.    Appearance: Normal appearance. She is obese.  HENT:     Head: Normocephalic and atraumatic.  Eyes:     General: No scleral icterus. Cardiovascular:     Rate and Rhythm: Normal rate.  Pulmonary:     Effort: Pulmonary effort is normal.  Abdominal:     Tenderness: There is no abdominal tenderness. There is no guarding.  Skin:    General: Skin is warm and dry.  Neurological:     Mental Status: She is alert and oriented to person, place, and time.  Psychiatric:         Behavior: Behavior normal.       Imaging: IR Radiologist Eval & Mgmt Result Date: 11/07/2024 EXAM: NEW PATIENT OFFICE VISIT CHIEF COMPLAINT: SEE NOTE IN EPIC HISTORY OF PRESENT ILLNESS: SEE NOTE IN EPIC REVIEW OF SYSTEMS: SEE NOTE IN EPIC PHYSICAL EXAMINATION: SEE NOTE IN EPIC ASSESSMENT AND PLAN: SEE NOTE IN EPIC Electronically Signed   By: Wilkie Lent M.D.   On: 11/07/2024 14:06   MR PELVIS W WO CONTRAST Result Date: 10/31/2024 CLINICAL DATA:  Uterine leiomyoma.  Painful menses. EXAM: MRI PELVIS WITHOUT AND WITH CONTRAST TECHNIQUE: Multiplanar multisequence MR imaging of the pelvis was performed both before and  after administration of intravenous contrast. CONTRAST:  10 mL of Vueway . COMPARISON:  CT scan abdomen and pelvis from 08/11/2021. FINDINGS: Urinary Tract: Limited evaluation of bilateral kidneys on coronal T2 weighted images. No discrete suspicious mass noted. No hydronephrosis. Urinary bladder is partially distended and appears within normal limits. Bowel:  No disproportionate dilation of small or large bowel loops. Vascular/Lymphatic: No pathologically enlarged lymph nodes. No significant vascular abnormality seen. Reproductive: Uterus is enlarged secondary to a well-circumscribed intramural 5.8 x 6.2 cm lesion. The lesion is in the posterior uterine body/fundus and pushes the endometrium anteriorly. The lesion is T1 isointense, T2 hypointense and exhibit contrast enhancement. Findings favor viable intramural leiomyoma. No other focal uterine lesion. The endometrium is displaced anteriorly and compressed measuring up to 2-2.5 mm in thickness. No focal endometrial lesion. The cervix and vagina are within normal limits.  No suspicious mass. Bilateral ovaries are visualized and appears within normal limits. (Series 3, image 17). No suspicious ovarian or adnexal mass seen. Other:  None. Musculoskeletal: No suspicious bone lesions identified. IMPRESSION: 1. There is a 5.8 x 6.2 cm intramural  leiomyoma in the posterior uterine body/fundus. 2. The endometrium is displaced anteriorly and compressed measuring up to 2-2.5 mm in thickness. No focal endometrial lesion. 3. Bilateral ovaries are within normal limits. No suspicious ovarian or adnexal mass seen. Electronically Signed   By: Ree Molt M.D.   On: 10/31/2024 15:17    Labs:  CBC: Recent Labs    08/21/24 1512  WBC 4.5  HGB 11.7  HCT 38.9  PLT 326    COAGS: No results for input(s): INR, APTT in the last 8760 hours.  BMP: No results for input(s): NA, K, CL, CO2, GLUCOSE, BUN, CALCIUM, CREATININE, GFRNONAA, GFRAA in the last 8760 hours.  Invalid input(s): CMP  LIVER FUNCTION TESTS: No results for input(s): BILITOT, AST, ALT, ALKPHOS, PROT, ALBUMIN in the last 8760 hours.  TUMOR MARKERS: No results for input(s): AFPTM, CEA, CA199, CHROMGRNA in the last 8760 hours.  Assessment and Plan:  39 year old morbidly obese female with highly symptomatic fibroids.  Her uterine fibroid symptom severity score is high at 6900 and her health-related quality of life score is low at 29 out of 100.  This indicates significant symptoms and overall poor quality of life.  We discussed uterine artery embolization in detail including the risks, benefits and alternatives.  Time was given to answer all of her questions.  She is somewhat interested in uterine artery embolization but remains very nervous about undergoing any type of procedure.  She would like to go home and think about things and will call us  if she wishes to proceed.  If she does wish to proceed, her procedure will need to be done at the hospital given her body habitus.  Thank you for this interesting consult.  I greatly enjoyed meeting Kimiko Common and look forward to participating in their care.  A copy of this report was sent to the requesting provider on this date.  Electronically Signed: Wilkie MARLA Lent 11/07/2024, 2:49 PM   I spent a total of 40 Minutes  in face to face in clinical consultation, greater than 50% of which was counseling/coordinating care for uterine fibroids   "

## 2024-11-26 ENCOUNTER — Ambulatory Visit: Payer: Medicare (Managed Care) | Admitting: Psychology

## 2024-12-13 ENCOUNTER — Telehealth: Payer: Medicare (Managed Care) | Admitting: Psychiatry
